# Patient Record
Sex: Female | Born: 1935 | Race: White | Hispanic: No | Marital: Single | Smoking: Never smoker
Health system: Southern US, Community
[De-identification: ages and names within clinical notes are randomized; demographics above are authoritative.]

## PROBLEM LIST (undated history)

## (undated) DIAGNOSIS — K5190 Ulcerative colitis, unspecified, without complications: Secondary | ICD-10-CM

## (undated) DIAGNOSIS — A048 Other specified bacterial intestinal infections: Secondary | ICD-10-CM

## (undated) DIAGNOSIS — N814 Uterovaginal prolapse, unspecified: Secondary | ICD-10-CM

## (undated) DIAGNOSIS — H349 Unspecified retinal vascular occlusion: Secondary | ICD-10-CM

## (undated) DIAGNOSIS — F419 Anxiety disorder, unspecified: Secondary | ICD-10-CM

## (undated) DIAGNOSIS — A0472 Enterocolitis due to Clostridium difficile, not specified as recurrent: Secondary | ICD-10-CM

## (undated) DIAGNOSIS — L0390 Cellulitis, unspecified: Secondary | ICD-10-CM

## (undated) DIAGNOSIS — M8580 Other specified disorders of bone density and structure, unspecified site: Secondary | ICD-10-CM

## (undated) HISTORY — DX: Anxiety disorder, unspecified: F41.9

## (undated) HISTORY — DX: Other specified disorders of bone density and structure, unspecified site: M85.80

## (undated) HISTORY — DX: Uterovaginal prolapse, unspecified: N81.4

## (undated) HISTORY — DX: Ulcerative colitis, unspecified, without complications: K51.90

## (undated) HISTORY — PX: TONSILLECTOMY: SUR1361

## (undated) HISTORY — PX: OTHER SURGICAL HISTORY: SHX169

## (undated) HISTORY — PX: COLONOSCOPY: SHX174

## (undated) HISTORY — DX: Cellulitis, unspecified: L03.90

## (undated) HISTORY — DX: Unspecified retinal vascular occlusion: H34.9

## (undated) SURGERY — Surgical Case
Anesthesia: *Unknown

---

## 2003-06-13 ENCOUNTER — Other Ambulatory Visit: Admission: RE | Admit: 2003-06-13 | Discharge: 2003-06-13 | Payer: Self-pay | Admitting: Obstetrics and Gynecology

## 2003-07-03 ENCOUNTER — Encounter: Admission: RE | Admit: 2003-07-03 | Discharge: 2003-07-03 | Payer: Self-pay | Admitting: Obstetrics and Gynecology

## 2003-12-29 ENCOUNTER — Encounter: Admission: RE | Admit: 2003-12-29 | Discharge: 2003-12-29 | Payer: Self-pay | Admitting: Family Medicine

## 2004-04-14 ENCOUNTER — Ambulatory Visit: Payer: Self-pay | Admitting: Internal Medicine

## 2004-05-03 ENCOUNTER — Ambulatory Visit: Payer: Self-pay | Admitting: Internal Medicine

## 2004-07-20 ENCOUNTER — Encounter: Admission: RE | Admit: 2004-07-20 | Discharge: 2004-07-20 | Payer: Self-pay | Admitting: Obstetrics and Gynecology

## 2004-10-28 ENCOUNTER — Ambulatory Visit: Payer: Self-pay | Admitting: Internal Medicine

## 2004-12-17 ENCOUNTER — Ambulatory Visit: Payer: Self-pay | Admitting: Internal Medicine

## 2005-01-19 ENCOUNTER — Ambulatory Visit: Payer: Self-pay | Admitting: Internal Medicine

## 2005-02-23 ENCOUNTER — Ambulatory Visit: Payer: Self-pay | Admitting: Internal Medicine

## 2005-03-02 ENCOUNTER — Other Ambulatory Visit: Admission: RE | Admit: 2005-03-02 | Discharge: 2005-03-02 | Payer: Self-pay | Admitting: Obstetrics and Gynecology

## 2005-07-25 ENCOUNTER — Encounter: Admission: RE | Admit: 2005-07-25 | Discharge: 2005-07-25 | Payer: Self-pay | Admitting: Obstetrics and Gynecology

## 2005-12-26 ENCOUNTER — Ambulatory Visit: Payer: Self-pay | Admitting: Internal Medicine

## 2006-04-10 ENCOUNTER — Other Ambulatory Visit: Admission: RE | Admit: 2006-04-10 | Discharge: 2006-04-10 | Payer: Self-pay | Admitting: Obstetrics and Gynecology

## 2006-08-23 ENCOUNTER — Encounter: Admission: RE | Admit: 2006-08-23 | Discharge: 2006-08-23 | Payer: Self-pay | Admitting: Obstetrics and Gynecology

## 2006-11-01 ENCOUNTER — Ambulatory Visit: Payer: Self-pay | Admitting: Internal Medicine

## 2006-11-28 ENCOUNTER — Encounter: Payer: Self-pay | Admitting: Internal Medicine

## 2006-11-28 ENCOUNTER — Ambulatory Visit: Payer: Self-pay | Admitting: Internal Medicine

## 2007-08-09 DIAGNOSIS — K5190 Ulcerative colitis, unspecified, without complications: Secondary | ICD-10-CM | POA: Insufficient documentation

## 2007-08-09 DIAGNOSIS — F411 Generalized anxiety disorder: Secondary | ICD-10-CM | POA: Insufficient documentation

## 2007-08-09 DIAGNOSIS — R109 Unspecified abdominal pain: Secondary | ICD-10-CM | POA: Insufficient documentation

## 2007-08-13 ENCOUNTER — Ambulatory Visit: Payer: Self-pay | Admitting: Internal Medicine

## 2007-08-28 ENCOUNTER — Encounter: Admission: RE | Admit: 2007-08-28 | Discharge: 2007-08-28 | Payer: Self-pay | Admitting: Obstetrics and Gynecology

## 2007-12-03 ENCOUNTER — Ambulatory Visit: Payer: Self-pay | Admitting: Internal Medicine

## 2007-12-03 ENCOUNTER — Telehealth: Payer: Self-pay | Admitting: Internal Medicine

## 2007-12-03 DIAGNOSIS — R142 Eructation: Secondary | ICD-10-CM | POA: Insufficient documentation

## 2007-12-03 DIAGNOSIS — R141 Gas pain: Secondary | ICD-10-CM | POA: Insufficient documentation

## 2007-12-03 DIAGNOSIS — R143 Flatulence: Secondary | ICD-10-CM

## 2007-12-04 LAB — CONVERTED CEMR LAB
Basophils Absolute: 0 10*3/uL (ref 0.0–0.1)
Basophils Relative: 0.6 % (ref 0.0–3.0)
Eosinophils Absolute: 0.1 10*3/uL (ref 0.0–0.7)
Eosinophils Relative: 1.5 % (ref 0.0–5.0)
HCT: 40.2 % (ref 36.0–46.0)
Hemoglobin: 13.8 g/dL (ref 12.0–15.0)
Lymphocytes Relative: 20.7 % (ref 12.0–46.0)
MCHC: 34.3 g/dL (ref 30.0–36.0)
MCV: 90 fL (ref 78.0–100.0)
Monocytes Absolute: 0.5 10*3/uL (ref 0.1–1.0)
Monocytes Relative: 6.3 % (ref 3.0–12.0)
Neutro Abs: 5.4 10*3/uL (ref 1.4–7.7)
Neutrophils Relative %: 70.9 % (ref 43.0–77.0)
Platelets: 341 10*3/uL (ref 150–400)
RBC: 4.46 M/uL (ref 3.87–5.11)
RDW: 13.2 % (ref 11.5–14.6)
WBC: 7.6 10*3/uL (ref 4.5–10.5)

## 2007-12-06 ENCOUNTER — Telehealth: Payer: Self-pay | Admitting: Internal Medicine

## 2007-12-31 ENCOUNTER — Ambulatory Visit: Payer: Self-pay | Admitting: Internal Medicine

## 2008-01-28 ENCOUNTER — Telehealth: Payer: Self-pay | Admitting: Internal Medicine

## 2008-03-10 ENCOUNTER — Ambulatory Visit: Payer: Self-pay | Admitting: Internal Medicine

## 2008-06-03 ENCOUNTER — Telehealth: Payer: Self-pay | Admitting: Internal Medicine

## 2008-07-31 ENCOUNTER — Ambulatory Visit: Payer: Self-pay | Admitting: Obstetrics and Gynecology

## 2008-07-31 ENCOUNTER — Other Ambulatory Visit: Admission: RE | Admit: 2008-07-31 | Discharge: 2008-07-31 | Payer: Self-pay | Admitting: Obstetrics and Gynecology

## 2008-07-31 ENCOUNTER — Encounter: Payer: Self-pay | Admitting: Obstetrics and Gynecology

## 2008-08-19 ENCOUNTER — Ambulatory Visit: Payer: Self-pay | Admitting: Obstetrics and Gynecology

## 2008-08-21 ENCOUNTER — Ambulatory Visit: Payer: Self-pay | Admitting: Internal Medicine

## 2008-08-22 LAB — CONVERTED CEMR LAB
ALT: 32 units/L (ref 0–35)
AST: 30 units/L (ref 0–37)
Albumin: 4.1 g/dL (ref 3.5–5.2)
Alkaline Phosphatase: 72 units/L (ref 39–117)
BUN: 19 mg/dL (ref 6–23)
Basophils Absolute: 0.1 10*3/uL (ref 0.0–0.1)
Basophils Relative: 1 % (ref 0.0–3.0)
CO2: 30 meq/L (ref 19–32)
Calcium: 9.3 mg/dL (ref 8.4–10.5)
Chloride: 102 meq/L (ref 96–112)
Creatinine, Ser: 0.6 mg/dL (ref 0.4–1.2)
Eosinophils Absolute: 0.2 10*3/uL (ref 0.0–0.7)
Eosinophils Relative: 2.9 % (ref 0.0–5.0)
GFR calc non Af Amer: 104.85 mL/min (ref >60)
Glucose, Bld: 94 mg/dL (ref 70–99)
HCT: 44.6 % (ref 36.0–46.0)
Hemoglobin: 15.2 g/dL — ABNORMAL HIGH (ref 12.0–15.0)
Lymphocytes Relative: 17.4 % (ref 12.0–46.0)
Lymphs Abs: 1.5 10*3/uL (ref 0.7–4.0)
MCHC: 34 g/dL (ref 30.0–36.0)
MCV: 92.9 fL (ref 78.0–100.0)
Monocytes Absolute: 0.3 10*3/uL (ref 0.1–1.0)
Monocytes Relative: 3.8 % (ref 3.0–12.0)
Neutro Abs: 6.4 10*3/uL (ref 1.4–7.7)
Neutrophils Relative %: 74.9 % (ref 43.0–77.0)
Platelets: 310 10*3/uL (ref 150.0–400.0)
Potassium: 4.3 meq/L (ref 3.5–5.1)
RBC: 4.8 M/uL (ref 3.87–5.11)
RDW: 13.7 % (ref 11.5–14.6)
Sed Rate: 17 mm/hr (ref 0–22)
Sodium: 139 meq/L (ref 135–145)
TSH: 1.01 microintl units/mL (ref 0.35–5.50)
Total Bilirubin: 1.6 mg/dL — ABNORMAL HIGH (ref 0.3–1.2)
Total Protein: 7.4 g/dL (ref 6.0–8.3)
WBC: 8.5 10*3/uL (ref 4.5–10.5)

## 2008-10-08 ENCOUNTER — Encounter: Admission: RE | Admit: 2008-10-08 | Discharge: 2008-10-08 | Payer: Self-pay | Admitting: Obstetrics and Gynecology

## 2008-10-14 ENCOUNTER — Encounter: Admission: RE | Admit: 2008-10-14 | Discharge: 2008-10-14 | Payer: Self-pay | Admitting: Obstetrics and Gynecology

## 2008-12-16 ENCOUNTER — Ambulatory Visit: Payer: Self-pay | Admitting: Internal Medicine

## 2009-04-27 ENCOUNTER — Telehealth: Payer: Self-pay | Admitting: Internal Medicine

## 2009-04-27 ENCOUNTER — Encounter (INDEPENDENT_AMBULATORY_CARE_PROVIDER_SITE_OTHER): Payer: Self-pay | Admitting: *Deleted

## 2009-06-10 ENCOUNTER — Ambulatory Visit: Payer: Self-pay | Admitting: Internal Medicine

## 2009-09-08 ENCOUNTER — Telehealth: Payer: Self-pay | Admitting: Internal Medicine

## 2009-09-09 ENCOUNTER — Ambulatory Visit: Payer: Self-pay | Admitting: Gastroenterology

## 2009-09-21 ENCOUNTER — Telehealth (INDEPENDENT_AMBULATORY_CARE_PROVIDER_SITE_OTHER): Payer: Self-pay | Admitting: *Deleted

## 2009-09-22 ENCOUNTER — Ambulatory Visit: Payer: Self-pay | Admitting: Obstetrics and Gynecology

## 2009-09-22 ENCOUNTER — Other Ambulatory Visit: Admission: RE | Admit: 2009-09-22 | Discharge: 2009-09-22 | Payer: Self-pay | Admitting: Obstetrics and Gynecology

## 2009-09-28 ENCOUNTER — Ambulatory Visit: Payer: Self-pay | Admitting: Internal Medicine

## 2009-09-29 LAB — CONVERTED CEMR LAB
Bilirubin Urine: NEGATIVE
Hemoglobin, Urine: NEGATIVE
Ketones, ur: NEGATIVE mg/dL
Leukocytes, UA: NEGATIVE
Nitrite: NEGATIVE
Specific Gravity, Urine: 1.02 (ref 1.000–1.030)
Total Protein, Urine: NEGATIVE mg/dL
Urine Glucose: NEGATIVE mg/dL
Urobilinogen, UA: 0.2 (ref 0.0–1.0)
pH: 7 (ref 5.0–8.0)

## 2009-10-15 ENCOUNTER — Encounter: Admission: RE | Admit: 2009-10-15 | Discharge: 2009-10-15 | Payer: Self-pay | Admitting: Obstetrics and Gynecology

## 2010-03-16 NOTE — Assessment & Plan Note (Signed)
Summary: UC flare/sheri   History of Present Illness Visit Type: Follow-up Visit Primary GI MD: [name] MD Primary Provider: [facility] Requesting Provider: n/a Chief Complaint: ulcerative colitis flare History of Present Illness:   Patient is followed by Dr. Juanda Chance for longstanding ulcerative colitis.  Patient last seen 06/10/09 at which time she was doing well on Lialda.  Earlier this month patient went to [place] to visit son and his family. Patient's daugher-in-law creates very stressful situation for the patient and this always causes gastrointestinal upset. After returning home from trip two weeks ago patient developed bloody, mucoid stools associated with lower abdominal cramps and lower back pain. No fevers. Today, the patient actually had a normal BM and feels better.    GI Review of Systems    Reports abdominal pain and  bloating.     Location of  Abdominal pain: lower abdomen.    Denies acid reflux, belching, chest pain, dysphagia with liquids, dysphagia with solids, heartburn, loss of appetite, nausea, vomiting, vomiting blood, weight loss, and  weight gain.      Reports rectal bleeding.     Denies anal fissure, black tarry stools, change in bowel habit, constipation, diarrhea, diverticulosis, fecal incontinence, heme positive stool, hemorrhoids, irritable bowel syndrome, jaundice, light color stool, liver problems, and  rectal pain.   Current Medications (verified): 1)  Methscopolamine Bromide 5 Mg  Tabs (Methscopolamine Bromide) .... Take 1/2 Tablet Every Morning, 1/2 Tablet Every Evening 2)  Ocuvite   Tabs (Multiple Vitamins-Minerals) .Marland Kitchen.. 1 Tablet Once Daily 3)  Lexapro 10 Mg  Tabs (Escitalopram Oxalate) .... Take 1 Tablet By Mouth Once A Day 4)  Vitamin D 1000 Unit Tabs (Cholecalciferol) .... Two Tablets By Mouth Once Daily 5)  Lialda 1.2 Gm Tbec (Mesalamine) .... Take 1 Tablet By Mouth Two Times A Day  Allergies (verified): 1)  ! Pcn  Past History:  Past Medical  History: Hx of ANXIETY (ICD-300.00) ULCERATIVE COLITIS (ICD-556.9)  Past Surgical History: Reviewed history from 12/03/2007 and no changes required. Tonsillectomy  Family History: Reviewed history from 03/10/2008 and no changes required. Family History of Colon Cancer: Mother Family History of Pancreatic Cancer: Father Family History of Heart Disease: Brother and mother Family History of Breast Cancer: Sister, mother, Aunt Family History of Clotting disorder: mother Family History of Inflammatory Bowel Disease: Sister?  Social History: Reviewed history from 12/03/2007 and no changes required. Alcohol Use - yes-3 glasses weekly Patient has never smoked.  Daily Caffeine Use-1-2 cups daily Illicit Drug Use - no Patient does not get regular exercise.   Review of Systems       The patient complains of arthritis/joint pain, back pain, night sweats, sleeping problems, and urination - excessive.  The patient denies allergy/sinus, anemia, anxiety-new, blood in urine, breast changes/lumps, change in vision, confusion, cough, coughing up blood, depression-new, fainting, fatigue, fever, headaches-new, hearing problems, heart murmur, heart rhythm changes, itching, menstrual pain, muscle pains/cramps, nosebleeds, pregnancy symptoms, shortness of breath, skin rash, sore throat, swelling of feet/legs, swollen lymph glands, thirst - excessive, urination changes/pain, urine leakage, vision changes, and voice change.    Vital Signs:  Patient profile:   75 year old female Height:      60 inches Weight:      148.50 pounds BMI:     29.11 Pulse rate:   64 / minute Pulse rhythm:   regular BP sitting:   116 / 78  (left arm) Cuff size:   regular  Vitals Entered By: June McMurray CMA (  AAMA) (September 09, 2009 1:26 PM)  Physical Exam  General:  Well developed, pleasant white female Head:  Normocephalic and atraumatic. Eyes:  Conjunctiva pink, no icterus.  Neck:  no obvious masses  Lungs:  Clear  throughout to auscultation. Heart:  Regular rate and rhythm; no murmurs, rubs,  or bruits. Abdomen:  Abdomen soft, nontender, nondistended. No obvious masses or hepatomegaly.Normal bowel sounds.  Msk:  Symmetrical with no gross deformities. Normal posture. Extremities:  No palmar erythema, no edema.  Neurologic:  Alert and  oriented x4;  grossly normal neurologically. Skin:  Intact without significant lesions or rashes. Cervical Nodes:  No significant cervical adenopathy. Psych:  Alert and cooperative. Normal mood and affect.  Impression & Recommendations:  Problem # 1:  ULCERATIVE COLITIS (ICD-556.9) Assessment Deteriorated Disease was inactive on last colonoscopy Oct. 2008. Had been doing well on Lialda until development of flare-like symptoms two weeks ago after encountering stressful situation. Patient actually doing better today. She looks good, abdominal exam benign. She has used Canasa suppositories and steroid enemas in the past. Will give her a two week course of Hydrocortisone enemas. Low residue diet until symptoms subside. Will schedule her for a routine follow up with Dr. Juanda Chance but in the meantime she will call with condition update in a few days.  Problem # 2:  Hx of ANXIETY (ICD-300.00) Assessment: Comment Only A certain family situation causes patient significant stress. Not exposed to the situation often but when she is, ulcerative colitis seems to flare. Patient prescribed Lexapro but doesn't take it everyday. We discussed need for therapeutic blood levels with these type medications. Patient will take daily as directed and understands stopping the medication may require a weaning process.  Patient Instructions: 1)  We made you a follow up with Dr. Juanda Chance for 09-28-09 at 8:45Am.  2)  We sent a prescription for Cortenemas to [facility].  3)  We have given you a Low Residue Diet. 4)  Copy sent to : [facility] 5)  The medication list was reviewed and  reconciled.  All changed / newly prescribed medications were explained.  A complete medication list was provided to the patient / caregiver.  Prescriptions: CORTENEMA 100 MG/60ML ENEM (HYDROCORTISONE) Use 1 enema  at bedtime x 14 days  #14 x 0   Entered by:   Lowry Ram NCMA   Authorized by:   Willette Cluster NP   Signed by:   Lowry Ram NCMA on 09/09/2009   Method used:   Electronically to        [facility]. [phone]* (retail)       [phone] [address].       [place], [place]  [address]       Ph: [phone]       Fax: [phone]   RxID:   [id number]

## 2010-03-16 NOTE — Letter (Signed)
Summary: Appt Reminder 2  [facility]  [address] [place], [place] [address]   Phone: [phone]  Fax: [phone]        April 27, 2009 MRN: [id number]    Erika Fowler [address] [place], [place]  [address]    Dear Ms. [name],   You have a return appointment with Dr.Dora Juanda Chance on 06-10-09 at 3:45pm. Please remember to bring a complete list of the medicines you are taking, your insurance card and your co-pay.  If you have to cancel or reschedule this appointment, please call before 5:00 pm the evening before to avoid a cancellation fee.  If you have any questions or concerns, please call [phone].    Sincerely,    Laureen Ochs LPN  Appended Document: Appt Reminder 2 Letter mailed to patient.

## 2010-03-16 NOTE — Assessment & Plan Note (Signed)
Summary: F/U FROM OV 12-16-08              Erika Fowler    History of Present Illness Visit Type: Follow-up Visit Primary GI MD: [name] MD Primary Provider: n/a Requesting Provider: n/a Chief Complaint: F/u for ulcerative colitis. Pt states that she is great and denies any GI complaints  History of Present Illness:   This is a 75 year old white female with left-sided ulcerative colitis since 29. Her last flareup was in the fall of 2009. She is maintained on Lialda 1.2 g twice a day. She denies any rectal bleeding or abdominal pain. Her last colonoscopy in October 2008 showed minimal activity of the left-sided colitis.   GI Review of Systems      Denies abdominal pain, acid reflux, belching, bloating, chest pain, dysphagia with liquids, dysphagia with solids, heartburn, loss of appetite, nausea, vomiting, vomiting blood, weight loss, and  weight gain.        Denies anal fissure, black tarry stools, change in bowel habit, constipation, diarrhea, diverticulosis, fecal incontinence, heme positive stool, hemorrhoids, irritable bowel syndrome, jaundice, light color stool, liver problems, rectal bleeding, and  rectal pain.    Current Medications (verified): 1)  Methscopolamine Bromide 5 Mg  Tabs (Methscopolamine Bromide) .... Take 1/2 Tablet Every Morning, 1/2 Tablet Every Evening 2)  Ocuvite   Tabs (Multiple Vitamins-Minerals) .Marland Kitchen.. 1 Tablet Once Daily 3)  Lexapro 10 Mg  Tabs (Escitalopram Oxalate) .... Take 1 Tablet By Mouth Once A Day 4)  Vitamin D 1000 Unit Tabs (Cholecalciferol) .... Two Tablets By Mouth Once Daily 5)  Lialda 1.2 Gm Tbec (Mesalamine) .... Take 1 Tablet By Mouth Two Times A Day  Allergies (verified): 1)  ! Pcn  Past History:  Past Medical History: Reviewed history from 08/09/2007 and no changes required. Current Problems:  ABDOMINAL CRAMPS (ICD-789.00) Hx of ANXIETY (ICD-300.00) ULCERATIVE COLITIS (ICD-556.9)  Past Surgical History: Reviewed history from  12/03/2007 and no changes required. Tonsillectomy  Family History: Reviewed history from 03/10/2008 and no changes required. Family History of Colon Cancer: Mother Family History of Pancreatic Cancer: Father Family History of Heart Disease: Brother and mother Family History of Breast Cancer: Sister, mother, Aunt Family History of Clotting disorder: mother Family History of Inflammatory Bowel Disease: Sister?  Social History: Reviewed history from 12/03/2007 and no changes required. Alcohol Use - yes-3 glasses weekly Patient has never smoked.  Daily Caffeine Use-1-2 cups daily Illicit Drug Use - no Patient does not get regular exercise.   Review of Systems  The patient denies allergy/sinus, anemia, anxiety-new, arthritis/joint pain, back pain, blood in urine, breast changes/lumps, change in vision, confusion, cough, coughing up blood, depression-new, fainting, fatigue, fever, headaches-new, hearing problems, heart murmur, heart rhythm changes, itching, menstrual pain, muscle pains/cramps, night sweats, nosebleeds, pregnancy symptoms, shortness of breath, skin rash, sleeping problems, sore throat, swelling of feet/legs, swollen lymph glands, thirst - excessive , urination - excessive , urination changes/pain, urine leakage, vision changes, and voice change.         Pertinent positive and negative review of systems were noted in the above HPI. All other ROS was otherwise negative.   Vital Signs:  Patient profile:   75 year old female Height:      61 inches Weight:      153 pounds BMI:     29.01 BSA:     1.69 Pulse rate:   88 / minute Pulse rhythm:   regular BP sitting:   126 / 74  (left arm)  Cuff size:   regular  Vitals Entered By: Ok Anis CMA (June 10, 2009 3:20 PM)  Physical Exam  General:  Well developed, well nourished, no acute distress. Eyes:  PERRLA, no icterus. Mouth:  No deformity or lesions, dentition normal. Neck:  Supple; no masses or thyromegaly. Lungs:   Clear throughout to auscultation. Heart:  Regular rate and rhythm; no murmurs, rubs,  or bruits. Abdomen:  Soft, nontender and nondistended. No masses, hepatosplenomegaly or hernias noted. Normal bowel sounds. Rectal:  Hemoccult positive yellow stool. Extremities:  No clubbing, cyanosis, edema or deformities noted. Skin:  Intact without significant lesions or rashes. Psych:  Alert and cooperative. Normal mood and affect.   Impression & Recommendations:  Problem # 1:  ULCERATIVE COLITIS (ICD-556.9) Patient has left-sided ulcerative colitis in symptomatic remission. Her last flareup was over 1 and 1/2 years ago. She is Hemoccult-positive today which could be due to hemorrhoids or possibly due to low activity of the left-sided colitis. She is to continue on her Lialda 1.2 g 2 tablets a day. She will return in 6 months.  Patient Instructions: 1)  Lialda 1.2 gm 2 per day. 2)  Office visit 6 months. 3)  The medication list was reviewed and reconciled.  All changed / newly prescribed medications were explained.  A complete medication list was provided to the patient / caregiver.

## 2010-03-16 NOTE — Progress Notes (Signed)
Summary: Progress report   Phone Note Call from Patient   Caller: Patient Call For: Erika Fowler CMA Summary of Call: The pt called to inform us that the cortenemas worked well and she is doing much better.  She asked me to let [name] know. Initial call taken by: Joselyn Glassman,  September 21, 2009 10:45 AM

## 2010-03-16 NOTE — Progress Notes (Signed)
Summary: Triage   Phone Note Call from Patient Call back at Home Phone [phone]   Caller: Patient Call For: Dr. Juanda Chance Reason for Call: Talk to Nurse Summary of Call: having rectal bleeding, lower back ache x1week Initial call taken by: Karna Christmas,  September 08, 2009 10:22 AM  Follow-up for Phone Call        Patient  c/o 1 week hx of urgency /diarrhea , blood and mucus in her stool.  Pain in her lower back and side that has been present for 1 week also.  She reports still on her Lialda and no other changes.  Pt will come in and see Willette Cluster RNP 09/09/09 1:30  Follow-up by: Darcey Nora RN, CGRN,  September 08, 2009 10:46 AM

## 2010-03-16 NOTE — Progress Notes (Signed)
Summary: REV Scheduled   Phone Note Call from Patient Call back at Home Phone [phone]   Caller: Patient Call For: Dr. Juanda Chance Reason for Call: Talk to Nurse Summary of Call: pt thinks that at her last office visit in 2010 Dr. Juanda Chance asked her to follow up sometime in 2011 regarding how pt is feeling on medications, Lialda, Lexapro and Mephscopolamine Bromide... pt wants to verify this before making an appt... pt not having any problems Initial call taken by: Vallarie Mare,  April 27, 2009 11:44 AM  Follow-up for Phone Call        Last OV 12-16-08, needs a 6 month f/u. She is scheduled for 06-10-09 at 3:45pm. No GI c/o at this time. Pt. instructed to call back as needed.  Follow-up by: Laureen Ochs LPN,  April 27, 2009 12:02 PM

## 2010-03-16 NOTE — Assessment & Plan Note (Signed)
Summary: F/U UC Flare, saw Willette Cluster NP    History of Present Illness Visit Type: Follow-up Visit Primary GI MD: [name] MD Primary Provider: [facility] Requesting Provider: n/a Chief Complaint: F/u for ulcerative colitis. Pt states that she feels better and denies any GI complaints  History of Present Illness:   This is a 75 year old white female with ulcerative colitis who is status post flareup 4 weeks ago after returning from a trip to see her family in [place]. The flare up was precipitated by stress as were the previous attacks. She has had ulcerative colitis since the 1980s. Her last colonoscopy in October 2008 showed an essentially normal colon. This time she responded to Cort enemas 1 qhs x 2 weeks. She has been off for at least a week and denies any visible blood per rectum or diarrhea but has left upper quadrant abdominal discomfort. She has a family history of colon cancer in her mother and questionable inflammatory bowel disease in her sister. She has been on Lialda 1.2 g 2 a day.   GI Review of Systems      Denies abdominal pain, acid reflux, belching, bloating, chest pain, dysphagia with liquids, dysphagia with solids, heartburn, loss of appetite, nausea, vomiting, vomiting blood, weight loss, and  weight gain.        Denies anal fissure, black tarry stools, change in bowel habit, constipation, diarrhea, diverticulosis, fecal incontinence, heme positive stool, hemorrhoids, irritable bowel syndrome, jaundice, light color stool, liver problems, rectal bleeding, and  rectal pain.    Current Medications (verified): 1)  Methscopolamine Bromide 5 Mg  Tabs (Methscopolamine Bromide) .... Take 1/2 Tablet Every Morning, 1/2 Tablet Every Evening 2)  Ocuvite   Tabs (Multiple Vitamins-Minerals) .Marland Kitchen.. 1 Tablet Once Daily 3)  Lexapro 10 Mg  Tabs (Escitalopram Oxalate) .... Take 1 Tablet By Mouth Once A Day 4)  Vitamin D 1000 Unit Tabs (Cholecalciferol) .... Two Tablets By  Mouth Once Daily 5)  Lialda 1.2 Gm Tbec (Mesalamine) .... Take 1 Tablet By Mouth Two Times A Day 6)  Cortenema 100 Mg/88ml Enem (Hydrocortisone) .... Use 1 Enema  At Bedtime X 14 Days 7)  Nitrofurantoin Macrocrystal 100 Mg Caps (Nitrofurantoin Macrocrystal) .... One Tablet By Mouth Two Times A Day(Finished Today)  Allergies (verified): 1)  ! Pcn  Past History:  Past Medical History: Reviewed history from 09/09/2009 and no changes required. Hx of ANXIETY (ICD-300.00) ULCERATIVE COLITIS (ICD-556.9)  Past Surgical History: Reviewed history from 12/03/2007 and no changes required. Tonsillectomy  Family History: Reviewed history from 03/10/2008 and no changes required. Family History of Colon Cancer: Mother Family History of Pancreatic Cancer: Father Family History of Heart Disease: Brother and mother Family History of Breast Cancer: Sister, mother, Aunt Family History of Clotting disorder: mother Family History of Inflammatory Bowel Disease: Sister?  Social History: Reviewed history from 12/03/2007 and no changes required. Alcohol Use - yes-3 glasses weekly Patient has never smoked.  Daily Caffeine Use-1-2 cups daily Illicit Drug Use - no Patient does not get regular exercise.   Review of Systems       Pertinent positive and negative review of systems were noted in the above HPI. All other ROS was otherwise negative.   Vital Signs:  Patient profile:   75 year old female Height:      60 inches Weight:      150 pounds BMI:     29.40 BSA:     1.65 Pulse rate:   62 / minute Pulse rhythm:  regular BP sitting:   120 / 80  (left arm) Cuff size:   regular  Vitals Entered By: Ok Anis CMA (September 28, 2009 8:46 AM)  Physical Exam  General:  Well developed, well nourished, no acute distress. Mouth:  No deformity or lesions, dentition normal. Neck:  Supple; no masses or thyromegaly. Lungs:  Clear throughout to auscultation. Heart:  Regular rate and rhythm; no murmurs,  rubs,  or bruits. Abdomen:  tenderness in left upper quadrant and along the sigmoid colon in the left lower quadrant. Right side of the abdomen is unremarkable. Normal active bowel sounds. No palpable mass. Rectal:  rectal and anoscopic exam reveals normal perianal area and mildly erythematous rectum, purulent with no friability, granularity or bleeding. Stool is Hemoccult positive. Extremities:  No clubbing, cyanosis, edema or deformities noted.   Impression & Recommendations:  Problem # 1:  ULCERATIVE COLITIS (ICD-556.9) Patient is status post flare up of ulcerative colitis. She has symptomatically improved on cort enemas. Stool is still Hemoccult-positive and her left colon is tender. She will resume cort enemas twice a week for 3 weeks.  Problem # 2:  Hx of ANXIETY (ICD-300.00) stable.  Other Orders: TLB-Udip w/ [facility] (81001-URINE)  Patient Instructions: 1)  Urinalysis today. 2)  Cort enema one at bedtime twice a week for 3 weeks. 3)  Office visit 6 months or earlier if she has another flareup. 4)  Copy sent to : [facility], Dr D.Gottsegan 5)  The medication list was reviewed and reconciled.  All changed / newly prescribed medications were explained.  A complete medication list was provided to the patient / caregiver. Prescriptions: CORTENEMA 100 MG/60ML ENEM (HYDROCORTISONE) Use 1 enema at bedtime twice per week x 3 weeks  #7 x 0   Entered by:   Lamona Curl CMA (AAMA)   Authorized by:   Hart Carwin MD   Signed by:   Lamona Curl CMA (AAMA) on 09/28/2009   Method used:   Electronically to        [facility]. [phone]* (retail)       [phone] [address].       [place], [place]  [address]       Ph: [phone]       Fax: [phone]   RxID:   [id number]

## 2010-03-31 ENCOUNTER — Encounter: Payer: Self-pay | Admitting: Internal Medicine

## 2010-03-31 ENCOUNTER — Other Ambulatory Visit: Payer: Self-pay | Admitting: Internal Medicine

## 2010-03-31 ENCOUNTER — Encounter (INDEPENDENT_AMBULATORY_CARE_PROVIDER_SITE_OTHER): Payer: Self-pay | Admitting: *Deleted

## 2010-03-31 ENCOUNTER — Other Ambulatory Visit: Payer: PRIVATE HEALTH INSURANCE

## 2010-03-31 ENCOUNTER — Ambulatory Visit (INDEPENDENT_AMBULATORY_CARE_PROVIDER_SITE_OTHER): Payer: PRIVATE HEALTH INSURANCE | Admitting: Internal Medicine

## 2010-03-31 DIAGNOSIS — K5100 Ulcerative (chronic) pancolitis without complications: Secondary | ICD-10-CM

## 2010-03-31 DIAGNOSIS — K5190 Ulcerative colitis, unspecified, without complications: Secondary | ICD-10-CM

## 2010-03-31 LAB — HEMOGLOBIN A1C: Hgb A1c MFr Bld: 6 % (ref 4.6–6.5)

## 2010-04-01 LAB — CBC WITH DIFFERENTIAL/PLATELET
Basophils Absolute: 0 10*3/uL (ref 0.0–0.1)
Eosinophils Absolute: 0 10*3/uL (ref 0.0–0.7)
Eosinophils Relative: 0.4 % (ref 0.0–5.0)
HCT: 44.5 % (ref 36.0–46.0)
Lymphocytes Relative: 22.2 % (ref 12.0–46.0)
MCV: 90.9 fl (ref 78.0–100.0)
Monocytes Absolute: 0.4 10*3/uL (ref 0.1–1.0)
Monocytes Relative: 4.8 % (ref 3.0–12.0)
Neutrophils Relative %: 72.2 % (ref 43.0–77.0)
Platelets: 291 10*3/uL (ref 150.0–400.0)
RBC: 4.9 Mil/uL (ref 3.87–5.11)
RDW: 14.2 % (ref 11.5–14.6)

## 2010-04-07 NOTE — Assessment & Plan Note (Signed)
Summary: [name]  SCHED WITH PT/MEDLIST/CX POLICY/WELLPATH   Vital Signs:  Patient profile:   75 year old female Height:      60 inches Weight:      149.50 pounds BMI:     29.30 Pulse rate:   88 / minute Pulse rhythm:   regular BP sitting:   118 / 68  (left arm)  Vitals Entered By: Milford Cage NCMA (March 31, 2010 1:59 PM)  History of Present Illness Visit Type: Follow-up Visit Primary GI MD: [name] MD Primary Provider: [facility] Requesting Provider: n/a Chief Complaint: follow-up Ulcerative Colitis History of Present Illness:   This is a 75 year old white female with ulcerative colitis since 35. Her last colonoscopy in 2008 showed a normal colon. She has been in remission since August 2011. She recently traveled to see her family in [place] under stressful circumstances. She denies any abdominal pain, diarrhea or rectal bleeding. She is on a maintenance dose of Lialda 1.2 g 2 tablets daily. She is also on Lexapro 10 mg a day and antispasmodics. There is a family history of colon cancer in her mother and inflammatory bowel disease in her sister.   GI Review of Systems      Denies abdominal pain, acid reflux, belching, bloating, chest pain, dysphagia with liquids, dysphagia with solids, heartburn, loss of appetite, nausea, vomiting, vomiting blood, weight loss, and  weight gain.        Denies anal fissure, black tarry stools, change in bowel habit, constipation, diarrhea, diverticulosis, fecal incontinence, heme positive stool, hemorrhoids, irritable bowel syndrome, jaundice, light color stool, liver problems, rectal bleeding, and  rectal pain.  Current Medications (verified): 1)  Methscopolamine Bromide 5 Mg  Tabs (Methscopolamine Bromide) .... Take 1/2 Tablet Every Morning, 1/2 Tablet Every Evening 2)  Ocuvite   Tabs (Multiple Vitamins-Minerals) .Marland Kitchen.. 1 Tablet Once Daily 3)  Lexapro 10 Mg  Tabs (Escitalopram Oxalate) .... Take 1 Tablet By Mouth Once A  Day 4)  Vitamin D 1000 Unit Tabs (Cholecalciferol) .... Two Tablets By Mouth Once Daily 5)  Lialda 1.2 Gm Tbec (Mesalamine) .... Take 1 Tablet By Mouth Two Times A Day 6)  Cortenema 100 Mg/75ml Enem (Hydrocortisone) .... Use 1 Enema At Bedtime Twice Per Week X 3 Weeks Use As Needed Uc Flare 7)  Nitrofurantoin Macrocrystal 100 Mg Caps (Nitrofurantoin Macrocrystal) .... One Tablet By Mouth Two Times A Day(Finished Today) As Needed Uc Flare  Allergies (verified): No Known Drug Allergies  Past History:  Past Medical History: Reviewed history from 09/09/2009 and no changes required. Hx of ANXIETY (ICD-300.00) ULCERATIVE COLITIS (ICD-556.9)  Past Surgical History: Reviewed history from 12/03/2007 and no changes required. Tonsillectomy  Family History: Reviewed history from 03/10/2008 and no changes required. Family History of Colon Cancer: Mother Family History of Pancreatic Cancer: Father Family History of Heart Disease: Brother and mother Family History of Breast Cancer: Sister, mother, Aunt Family History of Clotting disorder: mother Family History of Inflammatory Bowel Disease: Sister?  Social History: Reviewed history from 12/03/2007 and no changes required. Alcohol Use - yes-3 glasses weekly Patient has never smoked.  Daily Caffeine Use-1-2 cups daily Illicit Drug Use - no Patient does not get regular exercise.   Review of Systems  The patient denies allergy/sinus, anemia, anxiety-new, arthritis/joint pain, back pain, blood in urine, breast changes/lumps, change in vision, confusion, cough, coughing up blood, depression-new, fainting, fatigue, fever, headaches-new, hearing problems, heart murmur, heart rhythm changes, itching, menstrual pain, muscle pains/cramps, night sweats, nosebleeds, pregnancy symptoms, shortness  of breath, skin rash, sleeping problems, sore throat, swelling of feet/legs, swollen lymph glands, thirst - excessive , urination - excessive , urination  changes/pain, urine leakage, vision changes, and voice change.         Pertinent positive and negative review of systems were noted in the above HPI. All other ROS was otherwise negative.   Physical Exam  General:  Well developed, well nourished, no acute distress. Eyes:  PERRLA, no icterus. Mouth:  No deformity or lesions, dentition normal. Neck:  Supple; no masses or thyromegaly. Lungs:  Clear throughout to auscultation. Heart:  Regular rate and rhythm; no murmurs, rubs,  or bruits. Abdomen:  Soft, nontender and nondistended. No masses, hepatosplenomegaly or hernias noted. Normal bowel sounds. Extremities:  No clubbing, cyanosis, edema or deformities noted. Psych:  Alert and cooperative. Normal mood and affect.   Impression & Recommendations:  Problem # 1:  ULCERATIVE COLITIS (ICD-556.9) This is in remission. She needs to continue maintenance mesalamine. Her next colonoscopy will be due in October 2013. We will refill her mesalamine today and I will check her CBC and hemoglobin A1C. Orders: TLB-CBC Platelet - w/Differential (85025-CBCD) TLB-A1C / Hgb A1C (Glycohemoglobin) (83036-A1C)  Problem # 2:  ABDOMINAL CRAMPS (ICD-789.00) This is not an active problem. She will disconinue antispasmodics and take them only p.r.n.  Patient Instructions: 1)  Your physician requests that you go to the basement floor of our office to have the following labwork completed before leaving today: CBC, A1C. 2)  recall colonoscopy October 2013. 3)  Please pick up your prescriptions at the pharmacy. Electronic prescription(s) has already been sent for Lialda 2 tablets by mouth once daily. We have also sent Lexapro 10 mg once daily. 4)  The medication list was reviewed and reconciled.  All changed / newly prescribed medications were explained.  A complete medication list was provided to the patient / caregiver. Prescriptions: LEXAPRO 10 MG  TABS (ESCITALOPRAM OXALATE) Take 1 tablet by mouth once a day   #90 x 0   Entered by:   Lamona Curl CMA (AAMA)   Authorized by:   Hart Carwin MD   Signed by:   Lamona Curl CMA (AAMA) on 03/31/2010   Method used:   Electronically to        [facility]. [phone]* (retail)       [phone]       [place], [place]  [address]       Ph: [phone]       Fax: [phone]   RxID:   [id number] LIALDA 1.2 GM TBEC (MESALAMINE) Take 1 tablet by mouth two times a day  #180 x 0   Entered by:   Lamona Curl CMA (AAMA)   Authorized by:   Hart Carwin MD   Signed by:   Lamona Curl CMA (AAMA) on 03/31/2010   Method used:   Electronically to        [facility]. [phone]* (retail)       [phone]       [place], [place]  [address]       Ph: [phone]       Fax: [phone]   RxID:   [id number]    Orders Added: 1)  TLB-CBC Platelet - w/Differential [85025-CBCD] 2)  TLB-A1C / Hgb A1C (Glycohemoglobin) [62376-E8B]

## 2010-06-29 NOTE — Letter (Signed)
December 31, 2007    [employer]  Attention:  [facility] Appeals and Grievances  [address]  [place], [place] [address]   RE:  SELA, FALK  MRN:  [id number]  /  DOB:  07-06-1935   Dear Londell Moh:   This letter concerns our patient and your client, Ms. Trefz who has been  diagnosed with ulcerative colitis in the 1980s.  I have followed Ms.  Heckendorn since 2003 for predominantly left-sided ulcerative colitis.  She  was initially on sulfasalazine and subsequently on Asacol.  She has had  numerous flare-ups, usually controlled with oral prednisone.  Only  recently, we have switched her to Lialda 1.2 grams 2 tablets a day,  which has been tolerated very well by the patient.  She does not have  any of the side effects, which Asacol caused.  Unfortunately, the cost  has been quite prohibitive for her.  I have provided some samples to  her, but I would ask if you could work with her as far as providing  assistance for her.  I appreciate your help with this nice lady.    Sincerely,      Hedwig Morton. Juanda Chance, MD  Electronically Signed    DMB/MedQ  DD: 12/31/2007  DT: 01/01/2008  Job #: [id number]

## 2010-07-02 NOTE — Assessment & Plan Note (Signed)
[facility]                           GASTROENTEROLOGY OFFICE NOTE   KARISSA, MEENAN                        MRN:          [id number]  DATE:12/26/2005                            DOB:          03-22-1935    Ms. Woehrle is a 75 year old tall white female with ulcerative proctitis. Her  last colonoscopy March 25, 2003 showing ulcerative colitis left-sided,  moderately active from 0-60 cm confirmed on biopsies. She has done very well  since her last appointment in January of this year. Due to her travels to  [place] and [place] to see her children, she missed an appointment  but she is complete remission. Unfortunately, she was unable to take 6-  mercaptopurine 50 mg a day because of nausea, so she stayed with Canasa  suppositories and Asacol 400 mg three times a day which she has tolerated  very well. She is currently having regular bowel movements, no bleeding or  abdominal pain.   MEDICATIONS:  1. __________ 1/2 tablets b.i.d.  2. Asacol 400 mg p.o. t.i.d.  3. Effexor XR 75 mg daily.  4. Multivitamins.   She has not used Canasa suppositories recently.   PHYSICAL EXAMINATION:  VITAL SIGNS:  Blood pressure 132/84, pulse 84, weight  143 pounds.  GENERAL:  She was alert, oriented in no distress.  LUNGS:  Clear to auscultation.  COR:  Normal S1, normal S2.  ABDOMEN:  Soft, nontender with normal active bowel sounds.  RECTAL:  Shows normal rectal tone, there is Hemoccult negative stool.   IMPRESSION:  A 75 year old white female with left-sided ulcerative colitis  in remission currently asymptomatic.   PLAN:  1. Continue Canasa at 400 mg t.i.d.  2. Take Canasa suppositories on p.r.n. basis.  3. Sed rate and CBC today.  4. Office visit in 6 months.     Hedwig Morton. Juanda Chance, MD  Electronically Signed   DMB/MedQ  DD: 12/26/2005  DT: 12/26/2005  Job #: [id number]   cc:   Donia Guiles, M.D.

## 2010-09-13 ENCOUNTER — Other Ambulatory Visit: Payer: Self-pay | Admitting: Obstetrics and Gynecology

## 2010-09-13 DIAGNOSIS — Z1231 Encounter for screening mammogram for malignant neoplasm of breast: Secondary | ICD-10-CM

## 2010-10-27 ENCOUNTER — Encounter: Payer: Self-pay | Admitting: Gynecology

## 2010-10-27 DIAGNOSIS — N814 Uterovaginal prolapse, unspecified: Secondary | ICD-10-CM | POA: Insufficient documentation

## 2010-10-27 DIAGNOSIS — M8580 Other specified disorders of bone density and structure, unspecified site: Secondary | ICD-10-CM | POA: Insufficient documentation

## 2010-11-01 ENCOUNTER — Ambulatory Visit
Admission: RE | Admit: 2010-11-01 | Discharge: 2010-11-01 | Disposition: A | Discharge status: Home / Self Care | Payer: PRIVATE HEALTH INSURANCE | Source: Ambulatory Visit | Attending: Obstetrics and Gynecology | Admitting: Obstetrics and Gynecology

## 2010-11-01 DIAGNOSIS — Z1231 Encounter for screening mammogram for malignant neoplasm of breast: Secondary | ICD-10-CM

## 2010-11-09 ENCOUNTER — Ambulatory Visit (INDEPENDENT_AMBULATORY_CARE_PROVIDER_SITE_OTHER): Payer: PRIVATE HEALTH INSURANCE | Admitting: Obstetrics and Gynecology

## 2010-11-09 ENCOUNTER — Encounter: Payer: Self-pay | Admitting: Obstetrics and Gynecology

## 2010-11-09 VITALS — BP 124/76 | Ht 60.0 in | Wt 144.0 lb

## 2010-11-09 DIAGNOSIS — M899 Disorder of bone, unspecified: Secondary | ICD-10-CM

## 2010-11-09 DIAGNOSIS — N951 Menopausal and female climacteric states: Secondary | ICD-10-CM

## 2010-11-09 DIAGNOSIS — M8580 Other specified disorders of bone density and structure, unspecified site: Secondary | ICD-10-CM

## 2010-11-09 DIAGNOSIS — Z780 Asymptomatic menopausal state: Secondary | ICD-10-CM

## 2010-11-09 DIAGNOSIS — N952 Postmenopausal atrophic vaginitis: Secondary | ICD-10-CM

## 2010-11-09 DIAGNOSIS — N814 Uterovaginal prolapse, unspecified: Secondary | ICD-10-CM

## 2010-11-09 NOTE — Progress Notes (Signed)
  The patient came to see me today for further followup. She has uterine prolapse but really is just mildly symptomatic. She is having no vaginal bleeding. She is having no pelvic pain. She does have osteopenia but does not have an elevated FRAX risk. She's had no fractures. She takes calcium and vitamin D. Her menopausal symptoms are resolving. She does not need any intervention with HRT. She also is atrophic vaginitis but is fine without treatment. She is up-to-date on her mammograms.  Review of systems: 12 system reviewed. Positives include ulcerative colitis and possibly elevated potassium level. She also is being sent for an echo of her heart because of a heart murmur. All this is been resolved by her PCP. There are no other pertinent positives.  Physical examination: HEENT within normal limits. Neck: Thyroid not large. No masses. Supraclavicular nodes: not enlarged. Breasts: Examined in both sitting midline position. No skin changes and no masses. Abdomen: Soft no guarding rebound or masses or hernia. Pelvic: External: Within normal limits. BUS: Within normal limits. Vaginal:within normal limits. poor estrogen effect. No evidence of cystocele rectocele or enterocele. Cervix: clean. Uterus: Normal size and shape with one and a half degrees of uterine prolapse. Adnexa: No masses. Rectovaginal exam: Confirmatory and negative. Extremities: Within normal limits.  Assessment: #1. Uterine prolapse #2. Osteopenia #3. Atrophic vaginitis #4. Menopausal symptoms  Plan: Continue observation of all of the above. Continue yearly mammograms. Continue periodic bone densities.

## 2010-12-13 ENCOUNTER — Encounter: Payer: Self-pay | Admitting: Internal Medicine

## 2010-12-13 ENCOUNTER — Ambulatory Visit (INDEPENDENT_AMBULATORY_CARE_PROVIDER_SITE_OTHER): Payer: PRIVATE HEALTH INSURANCE | Admitting: Internal Medicine

## 2010-12-13 VITALS — BP 132/80 | HR 84 | Ht 62.99 in | Wt 156.0 lb

## 2010-12-13 DIAGNOSIS — K5150 Left sided colitis without complications: Secondary | ICD-10-CM

## 2010-12-13 DIAGNOSIS — R1013 Epigastric pain: Secondary | ICD-10-CM

## 2010-12-13 MED ORDER — MESALAMINE 1.2 G PO TBEC: DELAYED_RELEASE_TABLET | ORAL | Status: DC

## 2010-12-13 MED ORDER — HYDROCORTISONE 100 MG/60ML RE ENEM: ENEMA | RECTAL | Status: DC

## 2010-12-13 NOTE — Progress Notes (Signed)
Erika Fowler 09-27-1935 MRN [id number]    History of Present Illness:  This is a 75 year old white female with ulcerative colitis since 1980 predominantly in the left colon. Her last colonoscopy in 2008 was normal. She was in remission in August 2012  but has recently had a flareup after her trip to [place] to visit her son. She now is having blood per rectum, crampy abdominal pain and some diarrhea. She has been on Lialda 1.2 g twice a day. Her recent hemoglobin was 15. There is a family history of colon cancer in her mother. Her sister has inflammatory bowel disease.   Past Medical History  Diagnosis Date  . Colitis, ulcerative   . Osteopenia   . Uterine prolapse   . Anxiety    Past Surgical History  Procedure Date  . Tonsillectomy     reports that she has never smoked. She has never used smokeless tobacco. She reports that she drinks about 3.5 ounces of alcohol per week. Her drug history not on file. family history includes Breast cancer in her maternal aunt, mother, paternal aunt, and sister; Colon cancer in her mother; Heart disease in her mother; Hypertension in her sister; Inflammatory bowel disease in her sister; and Pancreatic cancer in her father. Allergies  Allergen Reactions  . Penicillins         Review of Systems: Denies upper GI symptoms except for occasional heartburn and indigestion  The remainder of the 10 point ROS is negative except as outlined in H&P   Physical Exam: General appearance  Well developed, in no distress. Eyes- non icteric. HEENT nontraumatic, normocephalic. Mouth no lesions, tongue papillated, no cheilosis. Neck supple without adenopathy, thyroid not enlarged, no carotid bruits, no JVD. Lungs Clear to auscultation bilaterally. Cor normal S1, normal S2, regular rhythm, no murmur,  quiet precordium. Abdomen: Mild tenderness left lower quadrant and epigastrium. Rectal: Hemoccult-positive stool. Extremities no pedal edema. Skin no  lesions. Neurological alert and oriented x 3. Psychological normal mood and affect.  Assessment and Plan:  Problem #1 Left sided ulcerative colitis with mild flareup. She will increase her mesalamine to 4.8 g daily and start cort enemas 3 times a week. I will see her in 8 weeks.  Problem #2 Epigastric discomfort. She has Pamine to take when necessary. Also, she will take OTC Pepcid, and continue Lexapro 10 mg daily.  Problem #3 Family history of colon cancer. She will be due for a colonoscopy in October 2013.   12/13/2010 [name]

## 2010-12-13 NOTE — Patient Instructions (Signed)
We have sent the following medications to your pharmacy for you to pick up at your convenience: Cort Enemas three times per week. Lialda 4 tablets daily. CC: Dr Lupita Raider

## 2011-01-28 ENCOUNTER — Other Ambulatory Visit: Payer: Self-pay | Admitting: Internal Medicine

## 2011-02-11 ENCOUNTER — Ambulatory Visit (INDEPENDENT_AMBULATORY_CARE_PROVIDER_SITE_OTHER): Payer: PRIVATE HEALTH INSURANCE | Admitting: Internal Medicine

## 2011-02-11 ENCOUNTER — Encounter: Payer: Self-pay | Admitting: Internal Medicine

## 2011-02-11 VITALS — BP 132/76 | HR 88 | Ht 60.0 in | Wt 145.0 lb

## 2011-02-11 DIAGNOSIS — K5100 Ulcerative (chronic) pancolitis without complications: Secondary | ICD-10-CM

## 2011-02-11 MED ORDER — ESCITALOPRAM OXALATE 10 MG PO TABS: 10.0000 mg | ORAL_TABLET | Freq: Every day | ORAL | Status: DC

## 2011-02-11 NOTE — Progress Notes (Signed)
Erika Fowler [birth year] MRN [id number]    History of Present Illness:  This is a 75 year old white female with left-sided ulcerative colitis. This is an eight-week followup since an exacerbation of her colitis in October after her visit to [place]. Today, she is asymptomatic. She increased her mesalamine to 4.8 g a day and used cort enemas daily and subsequently tapered them. She has been off cort enemas for several weeks. Her bowel habits are regular. She denies abdominal pain, rectal bleeding or diarrhea. There is a positive family history of colon cancer in her mother. She will be due for a recall colonoscopy in October 2013.   Past Medical History  Diagnosis Date  . Colitis, ulcerative   . Osteopenia   . Uterine prolapse   . Anxiety    Past Surgical History  Procedure Date  . Tonsillectomy     reports that she has never smoked. She has never used smokeless tobacco. She reports that she drinks about 3.5 ounces of alcohol per week. Her drug history not on file. family history includes Breast cancer in her maternal aunt, mother, paternal aunt, and sister; Colon cancer in her mother; Heart disease in her mother; Hypertension in her sister; Inflammatory bowel disease in her sister; and Pancreatic cancer in her father. Allergies  Allergen Reactions  . Penicillins         Review of Systems: Denies heartburn chest pain shortness of breath  The remainder of the 10 point ROS is negative except as outlined in H&P   Physical Exam: General appearance  Well developed, in no distress. Eyes- non icteric. HEENT nontraumatic, normocephalic. Mouth no lesions, tongue papillated, no cheilosis. Neck supple without adenopathy, thyroid not enlarged, no carotid bruits, no JVD. Lungs Clear to auscultation bilaterally. Cor normal S1, normal S2, regular rhythm, no murmur,  quiet precordium. Abdomen: Soft nontender abdomen with normal active bowel sounds. No distention. Liver edge at costal  margin. Rectal: Not done Extremities no pedal edema. Skin no lesions. Neurological alert and oriented x 3. Psychological normal mood and affect.  Assessment and Plan:  Problem #1 Left sided ulcerative colitis now in remission after a brief flareup. She will decrease her mesalamine to 2.4 g daily. She will continue on Lexapro. We will see her in 6 months for follow up. She will be due for a recall colonoscopy in October 2013.   02/11/2011 Erika Fowler

## 2011-02-11 NOTE — Patient Instructions (Signed)
We have sent the following medications to your pharmacy for you to pick up at your convenience: Lexapro Please follow up with Dr Juanda Chance in 6 months. CC: Dr Lupita Raider

## 2011-09-07 ENCOUNTER — Ambulatory Visit (INDEPENDENT_AMBULATORY_CARE_PROVIDER_SITE_OTHER): Payer: PRIVATE HEALTH INSURANCE | Admitting: Internal Medicine

## 2011-09-07 ENCOUNTER — Encounter: Payer: Self-pay | Admitting: Internal Medicine

## 2011-09-07 ENCOUNTER — Other Ambulatory Visit (INDEPENDENT_AMBULATORY_CARE_PROVIDER_SITE_OTHER): Payer: PRIVATE HEALTH INSURANCE

## 2011-09-07 VITALS — BP 130/76 | HR 80 | Wt 144.8 lb

## 2011-09-07 DIAGNOSIS — K5190 Ulcerative colitis, unspecified, without complications: Secondary | ICD-10-CM

## 2011-09-07 LAB — CBC WITH DIFFERENTIAL/PLATELET
Eosinophils Relative: 1.2 % (ref 0.0–5.0)
HCT: 45.6 % (ref 36.0–46.0)
Hemoglobin: 14.8 g/dL (ref 12.0–15.0)
Lymphocytes Relative: 27.5 % (ref 12.0–46.0)
Lymphs Abs: 2.1 10*3/uL (ref 0.7–4.0)
Monocytes Relative: 6.8 % (ref 3.0–12.0)
Neutro Abs: 4.8 10*3/uL (ref 1.4–7.7)
WBC: 7.5 10*3/uL (ref 4.5–10.5)

## 2011-09-07 LAB — SEDIMENTATION RATE: Sed Rate: 14 mm/hr (ref 0–22)

## 2011-09-07 MED ORDER — HYDROCORTISONE 100 MG/60ML RE ENEM: 100.0000 mg | ENEMA | Freq: Every day | RECTAL | Status: DC

## 2011-09-07 MED ORDER — ESCITALOPRAM OXALATE 10 MG PO TABS: 10.0000 mg | ORAL_TABLET | Freq: Every day | ORAL | Status: DC

## 2011-09-07 MED ORDER — MESALAMINE 1.2 G PO TBEC: DELAYED_RELEASE_TABLET | ORAL | Status: DC

## 2011-09-07 NOTE — Patient Instructions (Addendum)
We have sent the following medications to your pharmacy for you to pick up at your convenience: Lexapro Lialda Cort Enema Your physician has requested that you go to the basement for the following lab work before leaving today: CBC, Sed Rate You will be due for a recall colonoscopy in 11/2011. We will send you a reminder in the mail when it gets closer to that time. CC: Dr Lupita Raider

## 2011-09-07 NOTE — Progress Notes (Signed)
Erika Fowler 1935/10/28 MRN [id number]   History of Present Illness:  This is a 77 year old white female with ulcerative colitis since 54. Her last office visit was in December 2012. She has been in remission since she returned from [place] in December 2012. She is on a maintenance dose of mesalamine 2.4 g a day. She used several cort enemas in May when she thought that her colitis was flaring up but she's currently doing well. There is a family history of colon cancer in her mother. Her last colonoscopy was in October 2008. A recall colonoscopy will be due in October 2013.    Past Medical History  Diagnosis Date  . Colitis, ulcerative   . Osteopenia   . Uterine prolapse   . Anxiety    Past Surgical History  Procedure Date  . Tonsillectomy     reports that she has never smoked. She has never used smokeless tobacco. She reports that she drinks about 3.5 ounces of alcohol per week. Her drug history not on file. family history includes Breast cancer in her maternal aunt, mother, paternal aunt, and sister; Colon cancer in her mother; Heart disease in her mother; Hypertension in her sister; Inflammatory bowel disease in her sister; and Pancreatic cancer in her father. Allergies  Allergen Reactions  . Penicillins         Review of Systems: Denies abdominal pain rectal bleeding  The remainder of the 10 point ROS is negative except as outlined in H&P   Physical Exam: General appearance  Well developed, in no distress. Eyes- non icteric. HEENT nontraumatic, normocephalic. Mouth no lesions, tongue papillated, no cheilosis. Neck supple without adenopathy, thyroid not enlarged, no carotid bruits, no JVD. Lungs Clear to auscultation bilaterally. Cor normal S1, normal S2, regular rhythm, no murmur,  quiet precordium. Abdomen: Soft, nontender with normoactive bowel sounds. No distention. Rectal: Not done. Extremities no pedal edema. Skin no lesions. Neurological alert and  oriented x 3. Psychological normal mood and affect.  Assessment and Plan:  Problem #1 Ulcerative colitis in remission. Patient is to continue a maintenance dose of mesalamine. We will also give her refills for Lexapro. We will complete a CBC today. She will need an office visit in 6 months. A recall colonoscopy will be due in October 2013.   09/07/2011 [name]

## 2011-10-20 ENCOUNTER — Encounter: Payer: Self-pay | Admitting: Internal Medicine

## 2011-10-31 ENCOUNTER — Other Ambulatory Visit: Payer: Self-pay | Admitting: Family Medicine

## 2011-10-31 DIAGNOSIS — N951 Menopausal and female climacteric states: Secondary | ICD-10-CM

## 2011-10-31 DIAGNOSIS — Z1231 Encounter for screening mammogram for malignant neoplasm of breast: Secondary | ICD-10-CM

## 2011-11-21 ENCOUNTER — Encounter: Payer: Self-pay | Admitting: Internal Medicine

## 2011-11-22 ENCOUNTER — Encounter: Payer: Self-pay | Admitting: Obstetrics and Gynecology

## 2011-11-22 ENCOUNTER — Ambulatory Visit (INDEPENDENT_AMBULATORY_CARE_PROVIDER_SITE_OTHER): Payer: PRIVATE HEALTH INSURANCE | Admitting: Obstetrics and Gynecology

## 2011-11-22 VITALS — BP 124/76 | Ht 60.0 in | Wt 146.0 lb

## 2011-11-22 DIAGNOSIS — M899 Disorder of bone, unspecified: Secondary | ICD-10-CM

## 2011-11-22 DIAGNOSIS — M8580 Other specified disorders of bone density and structure, unspecified site: Secondary | ICD-10-CM

## 2011-11-22 DIAGNOSIS — N393 Stress incontinence (female) (male): Secondary | ICD-10-CM

## 2011-11-22 DIAGNOSIS — N814 Uterovaginal prolapse, unspecified: Secondary | ICD-10-CM

## 2011-11-22 DIAGNOSIS — Z780 Asymptomatic menopausal state: Secondary | ICD-10-CM

## 2011-11-22 NOTE — Patient Instructions (Signed)
Schedule appointment at the cancer center for genetic counseling for BRCA1 and BRCA2. Mammogram and  bone density.

## 2011-11-22 NOTE — Progress Notes (Signed)
Patient came to see me today for further followup. We are watching her with uterine prolapse and mild urinary stress incontinence. Although she is aware of her symptoms she does not feel they are  Intolerable enough  to have surgery. She is having no vaginal bleeding. She is having no pelvic pain. She continues to have hot flashes but does not want to reinitiate hormone replacement therapy. She does have osteopenia without an elevated fracture risk. She is scheduled a yearly mammogram and followup bone density. She has always had normal Pap smears. Her last Pap smear was 2011. She has a mother who had early onset breast cancer. She has 2 sisters and a maternal aunt who had breast cancer after  the age of 71. She has no family history of ovarian cancer.  ROS: 12 system review done. Pertinent positives above. Other positives are anxiety, ulcerative colitis.  Physical examination:Erika Fowler present.HEENT within normal limits. Neck: Thyroid not large. No masses. Supraclavicular nodes: not enlarged. Breasts: Examined in both sitting and lying  position. No skin changes and no masses. Abdomen: Soft no guarding rebound or masses or hernia. Pelvic: External: Within normal limits. BUS: Within normal limits. Vaginal:within normal limits. Good estrogen effect. No evidence of cystocele rectocele or enterocele. Cervix: clean. Uterus: Normal size and shape with  second-degree prolapse. Adnexa: No masses. Rectovaginal exam: Confirmatory and negative. Extremities: Within normal limits.  Assessment: #1. Menopausal symptoms #2. Osteopenia #3. Uterine prolapse #4. Mother with early onset breast cancer.  Plan: Mammogram and  bone density. Observation of uterine prolapse. Appointment at the cancer center for genetic counseling. Pap not done.The new Pap smear guidelines were discussed with the patient.

## 2011-11-24 ENCOUNTER — Ambulatory Visit
Admission: RE | Admit: 2011-11-24 | Discharge: 2011-11-24 | Disposition: A | Discharge status: Home / Self Care | Payer: Medicare Other | Source: Ambulatory Visit | Attending: Family Medicine | Admitting: Family Medicine

## 2011-11-24 DIAGNOSIS — Z1231 Encounter for screening mammogram for malignant neoplasm of breast: Secondary | ICD-10-CM

## 2011-11-24 DIAGNOSIS — N951 Menopausal and female climacteric states: Secondary | ICD-10-CM

## 2011-11-28 ENCOUNTER — Encounter: Payer: Self-pay | Admitting: Obstetrics and Gynecology

## 2012-01-17 ENCOUNTER — Encounter: Payer: PRIVATE HEALTH INSURANCE | Admitting: Internal Medicine

## 2012-01-31 ENCOUNTER — Encounter: Payer: Self-pay | Admitting: Internal Medicine

## 2012-02-28 ENCOUNTER — Ambulatory Visit (AMBULATORY_SURGERY_CENTER): Payer: Medicare Other | Admitting: *Deleted

## 2012-02-28 VITALS — Ht 60.0 in | Wt 150.0 lb

## 2012-02-28 DIAGNOSIS — K5190 Ulcerative colitis, unspecified, without complications: Secondary | ICD-10-CM

## 2012-02-28 MED ORDER — MOVIPREP 100 G PO SOLR: ORAL | Status: DC

## 2012-03-13 ENCOUNTER — Encounter: Payer: Self-pay | Admitting: Internal Medicine

## 2012-03-13 ENCOUNTER — Ambulatory Visit (AMBULATORY_SURGERY_CENTER): Payer: Medicare Other | Admitting: Internal Medicine

## 2012-03-13 VITALS — BP 126/66 | HR 68 | Temp 97.1°F | Resp 30 | Ht 60.0 in | Wt 146.0 lb

## 2012-03-13 DIAGNOSIS — D126 Benign neoplasm of colon, unspecified: Secondary | ICD-10-CM

## 2012-03-13 DIAGNOSIS — K5289 Other specified noninfective gastroenteritis and colitis: Secondary | ICD-10-CM

## 2012-03-13 DIAGNOSIS — K5190 Ulcerative colitis, unspecified, without complications: Secondary | ICD-10-CM

## 2012-03-13 MED ORDER — MESALAMINE 1000 MG RE SUPP: 1000.0000 mg | Freq: Every day | RECTAL | Status: DC

## 2012-03-13 MED ORDER — SODIUM CHLORIDE 0.9 % IV SOLN: 500.0000 mL | INTRAVENOUS | Status: DC

## 2012-03-13 NOTE — Progress Notes (Signed)
Lidocaine-40mg IV prior to Propofol InductionPropofol given over incremental dosages 

## 2012-03-13 NOTE — Progress Notes (Addendum)
Patient did not have preoperative order for IV antibiotic SSI prophylaxis. (G8918)  Patient did not experience any of the following events: a burn prior to discharge; a fall within the facility; wrong site/side/patient/procedure/implant event; or a hospital transfer or hospital admission upon discharge from the facility. (G8907)  

## 2012-03-13 NOTE — Op Note (Signed)
 [facility] [address] [place], [address]   COLONOSCOPY PROCEDURE REPORT  PATIENT: Erika Fowler, Erika Fowler  MR#: [id number] BIRTHDATE: [birth year] , 76  yrs. old GENDER: Female ENDOSCOPIST: Hart Carwin, MD REFERRED BY:  Lupita Raider, M.D. PROCEDURE DATE:  03/13/2012 PROCEDURE:   Colonoscopy, surveillance ASA CLASS:   Class II INDICATIONS:Screening and surveillance,personal history of colonic polyps and High risk patient < left sided UC since 1980, huperplastic polyp 2008, MEDICATIONS: MAC sedation, administered by CRNA and propofol (Diprivan) 150mg  IV  DESCRIPTION OF PROCEDURE:   After the risks and benefits and of the procedure were explained, informed consent was obtained.  A digital rectal exam revealed no abnormalities of the rectum.    The LB [id number]-[id number]  endoscope was introduced through the anus and advanced to the cecum, which was identified by both the appendix and ileocecal valve .  The quality of the prep was excellent, using MoviPrep .  The instrument was then slowly withdrawn as the colon was fully examined.   active colitis 0-30 cm, characterized by granularity, some contact bleeding, edema with loss of vascular pattern, no skipped lesions,or pseudopolyps, normal appearing descending colon and right colon to the cecum- random biopsies taken from the R,transv and L colon          Retroflexed views revealed no abnormalities. The scope was then withdrawn from the patient and the procedure completed.  COMPLICATIONS: There were no complications. ENDOSCOPIC IMPRESSION:  avtive ulcerative colitis, mild to moderate from 0-30 cm ( sigmoid colon), s/p random biopsies to r/o dysplasia right and transverse colon appear normal  RECOMMENDATIONS: 1.  Await biopsy results 2.   continue Lialda 4.8 gm/day 3. consider Canasa supp 1000mg  hs, she has not been having any symptoms from her colitis   REPEAT EXAM: for Colonoscopy. 3-5  years  cc:  _______________________________ eSignedHart Carwin, MD 03/13/2012 10:12 AM     PATIENT NAME:  [name], [name] MR#: [id number]

## 2012-03-13 NOTE — Progress Notes (Signed)
Called to room to assist during endoscopic procedure.  Patient ID and intended procedure confirmed with present staff. Received instructions for my participation in the procedure from the performing physician.  

## 2012-03-13 NOTE — Patient Instructions (Addendum)

## 2012-03-14 ENCOUNTER — Telehealth: Payer: Self-pay | Admitting: *Deleted

## 2012-03-14 NOTE — Telephone Encounter (Signed)
  Follow up Call-  Call back number 03/13/2012  Post procedure Call Back phone  # [phone]  Permission to leave phone message Yes     Patient questions:  Do you have a fever, pain , or abdominal swelling? no Pain Score  0 *  Have you tolerated food without any problems? yes  Have you been able to return to your normal activities? yes  Do you have any questions about your discharge instructions: Diet   no Medications  no Follow up visit  no  Do you have questions or concerns about your Care? no  Actions: * If pain score is 4 or above: No action needed, pain <4.

## 2012-03-19 ENCOUNTER — Encounter: Payer: Self-pay | Admitting: Internal Medicine

## 2012-05-15 DIAGNOSIS — H349 Unspecified retinal vascular occlusion: Secondary | ICD-10-CM

## 2012-05-15 HISTORY — DX: Unspecified retinal vascular occlusion: H34.9

## 2012-05-18 ENCOUNTER — Ambulatory Visit: Payer: Medicare Other | Admitting: Internal Medicine

## 2012-05-25 ENCOUNTER — Ambulatory Visit: Payer: Medicare Other | Admitting: Internal Medicine

## 2012-07-10 ENCOUNTER — Encounter: Payer: Self-pay | Admitting: Internal Medicine

## 2012-07-10 ENCOUNTER — Ambulatory Visit (INDEPENDENT_AMBULATORY_CARE_PROVIDER_SITE_OTHER): Payer: Medicare Other | Admitting: Internal Medicine

## 2012-07-10 VITALS — BP 120/80 | HR 60 | Ht 60.0 in | Wt 146.6 lb

## 2012-07-10 DIAGNOSIS — K5100 Ulcerative (chronic) pancolitis without complications: Secondary | ICD-10-CM

## 2012-07-10 MED ORDER — ESCITALOPRAM OXALATE 10 MG PO TABS: 10.0000 mg | ORAL_TABLET | Freq: Every day | ORAL | Status: DC

## 2012-07-10 NOTE — Progress Notes (Signed)
Erika Fowler July 25, 1935 MRN [id number]        History of Present Illness:  This is a 77 year old white female with a left-sided ulcerative colitis since 1980s, status post recent colonoscopy on 03/13/2012 which showed mild active colitis from 0-30 cm and  chronic changes from 40-60 cm. We have increased her Lialda to 4.8 g daily but she still takes only 2 a day which is 2.4 g. She denies any symptoms such as rectal bleeding, diarrhea or abdominal pain. She has a history of hyperplastic polyp on colonoscopy in 2008.   Past Medical History  Diagnosis Date  . Colitis, ulcerative   . Osteopenia   . Uterine prolapse   . Anxiety   . 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 Tonsillectomy      reports that she has never smoked. She has never used smokeless tobacco. She reports that she drinks about 3.5 ounces of alcohol per week. She reports that she does not use illicit drugs. family history includes Breast cancer in her maternal aunts, mother, paternal aunt, and sister; Colon cancer (age of onset: 69) in her mother; Heart disease in her mother; Hypertension in her sister; Inflammatory bowel disease in her sister; and Pancreatic cancer in her father.  There is no history of Stomach cancer. Allergies  Allergen Reactions  . Penicillins     Per pt: unknown        Review of Systems: Denies abdominal pain rectal bleeding  The remainder of the 10 point ROS is negative except as outlined in H&P   Physical Exam: General appearance  Well developed, in no distress. Eyes- non icteric. HEENT nontraumatic, normocephalic. Mouth no lesions, tongue papillated, no cheilosis. Neck supple without adenopathy, thyroid not enlarged, no carotid bruits, no JVD. Lungs Clear to auscultation bilaterally. Cor normal S1, normal S2, regular rhythm, no murmur,  quiet precordium. Abdomen: Soft relaxed abdomen with normoactive bowel sounds. No  distention. Liver edge at costal margin Rectal: Not done Extremities no pedal edema. Skin no lesions. Neurological alert and oriented x 3. Psychological normal mood and affect.  Assessment and Plan:  77 year old white female with left sided mild ulcerative colitis of 30 years duration, currently asymptomatic. She will continue on current dose of the mesalamine 2.4 g daily. We will refill her Lexapro 10 mg daily and I will see her in 6 months for follow up, we will next time check  bone density as well as give Pneumovax as per IBD excellence guidelines.   07/10/2012 [name]

## 2012-07-10 NOTE — Patient Instructions (Addendum)
We have sent the following medications to your pharmacy for you to pick up at your convenience: Lexapro. Dr Philipp Deputy

## 2012-10-23 ENCOUNTER — Other Ambulatory Visit: Payer: Self-pay

## 2012-10-23 DIAGNOSIS — Z1231 Encounter for screening mammogram for malignant neoplasm of breast: Secondary | ICD-10-CM

## 2012-10-23 DIAGNOSIS — Z803 Family history of malignant neoplasm of breast: Secondary | ICD-10-CM

## 2012-11-14 ENCOUNTER — Other Ambulatory Visit: Payer: Self-pay | Admitting: *Deleted

## 2012-11-14 MED ORDER — MESALAMINE 1.2 G PO TBEC: DELAYED_RELEASE_TABLET | ORAL | Status: DC

## 2012-11-22 ENCOUNTER — Encounter: Payer: Self-pay | Admitting: Gynecology

## 2012-11-27 ENCOUNTER — Ambulatory Visit: Payer: Medicare Other

## 2012-11-27 ENCOUNTER — Ambulatory Visit
Admission: RE | Admit: 2012-11-27 | Discharge: 2012-11-27 | Disposition: A | Discharge status: Home / Self Care | Payer: Medicare Other | Source: Ambulatory Visit

## 2012-11-27 DIAGNOSIS — Z803 Family history of malignant neoplasm of breast: Secondary | ICD-10-CM

## 2012-11-27 DIAGNOSIS — Z1231 Encounter for screening mammogram for malignant neoplasm of breast: Secondary | ICD-10-CM

## 2013-04-05 ENCOUNTER — Ambulatory Visit (INDEPENDENT_AMBULATORY_CARE_PROVIDER_SITE_OTHER): Payer: Medicare Other | Admitting: Internal Medicine

## 2013-04-05 ENCOUNTER — Other Ambulatory Visit (INDEPENDENT_AMBULATORY_CARE_PROVIDER_SITE_OTHER): Payer: Medicare Other

## 2013-04-05 ENCOUNTER — Encounter: Payer: Self-pay | Admitting: Internal Medicine

## 2013-04-05 VITALS — BP 122/84 | HR 80 | Ht 60.0 in | Wt 149.0 lb

## 2013-04-05 DIAGNOSIS — R1319 Other dysphagia: Secondary | ICD-10-CM

## 2013-04-05 DIAGNOSIS — K5190 Ulcerative colitis, unspecified, without complications: Secondary | ICD-10-CM

## 2013-04-05 LAB — CBC WITH DIFFERENTIAL/PLATELET
BASOS ABS: 0 10*3/uL (ref 0.0–0.1)
Basophils Relative: 0.3 % (ref 0.0–3.0)
EOS ABS: 0.1 10*3/uL (ref 0.0–0.7)
Eosinophils Relative: 1 % (ref 0.0–5.0)
HCT: 46.6 % — ABNORMAL HIGH (ref 36.0–46.0)
HEMOGLOBIN: 15.2 g/dL — AB (ref 12.0–15.0)
LYMPHS ABS: 2.3 10*3/uL (ref 0.7–4.0)
LYMPHS PCT: 26.6 % (ref 12.0–46.0)
MCHC: 32.6 g/dL (ref 30.0–36.0)
MCV: 93.9 fl (ref 78.0–100.0)
Monocytes Absolute: 0.5 10*3/uL (ref 0.1–1.0)
Monocytes Relative: 5.4 % (ref 3.0–12.0)
NEUTROS ABS: 5.7 10*3/uL (ref 1.4–7.7)
Neutrophils Relative %: 66.7 % (ref 43.0–77.0)
Platelets: 311 10*3/uL (ref 150.0–400.0)
RBC: 4.97 Mil/uL (ref 3.87–5.11)
RDW: 14.2 % (ref 11.5–14.6)
WBC: 8.6 10*3/uL (ref 4.5–10.5)

## 2013-04-05 LAB — COMPREHENSIVE METABOLIC PANEL
ALT: 28 U/L (ref 0–35)
AST: 22 U/L (ref 0–37)
Albumin: 4.3 g/dL (ref 3.5–5.2)
Alkaline Phosphatase: 70 U/L (ref 39–117)
BUN: 18 mg/dL (ref 6–23)
CALCIUM: 9.7 mg/dL (ref 8.4–10.5)
CHLORIDE: 104 meq/L (ref 96–112)
CO2: 27 mEq/L (ref 19–32)
CREATININE: 0.6 mg/dL (ref 0.4–1.2)
GFR: 101.55 mL/min (ref >60.00)
Glucose, Bld: 92 mg/dL (ref 70–99)
POTASSIUM: 4.1 meq/L (ref 3.5–5.1)
SODIUM: 139 meq/L (ref 135–145)
TOTAL PROTEIN: 7.6 g/dL (ref 6.0–8.3)
Total Bilirubin: 0.7 mg/dL (ref 0.3–1.2)

## 2013-04-05 LAB — SEDIMENTATION RATE: Sed Rate: 14 mm/hr (ref 0–22)

## 2013-04-05 MED ORDER — RANITIDINE HCL 150 MG PO TABS: 150.0000 mg | ORAL_TABLET | Freq: Every day | ORAL | Status: DC

## 2013-04-05 MED ORDER — MESALAMINE 1.2 G PO TBEC: DELAYED_RELEASE_TABLET | ORAL | Status: DC

## 2013-04-05 NOTE — Progress Notes (Signed)
Erika Fowler 1936/01/02 [id number]  Note: This dictation was prepared with Dragon digital system. Any transcriptional errors that result from this procedure are unintentional.   History of Present Illness: This is a 78 year old white female left with sided ulcerative colitis. Last office visit May 2014. Initial diagnosis made in 1980. Last colonoscopy in January 2014 showed mildly active colitis from 0-30 cm and chronic colitis from 40-60 cm. She is doing very well. Denies rectal bleeding or diarrhea. She takes mesalamine 2.4 g daily. Today she is complaining of globus sensation in her throat.. She denies true dysphagia but has had feeling of a lump in the back of her throat. Partially relieved by TUMS 4-5 / day.    Past Medical History  Diagnosis Date  . Colitis, ulcerative   . Osteopenia   . Uterine prolapse   . Anxiety   . 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 Tonsillectomy      Allergies  Allergen Reactions  . Penicillins     Per pt: unknown    Family history and social history have been reviewed.  Review of Systems: Negative for chest pain diarrhea rectal bleeding  The remainder of the 10 point ROS is negative except as outlined in the H&P  Physical Exam: General Appearance Well developed, in no distress Psychological Normal mood and affect  Assessment and Plan:   78 year old white female with the left-sided ulcerative colitis in remission. She will continue the same dose of mesalamine. She will return in 1 year. Check CBC , metabolic panel and sed rates today Globus sensation in the throat. May be related to  anxiety or to gastroesophageal reflux. We will start ranitidine 150 mg at bedtime, 3 refills. If symptoms don't subside the next 6-8 weeks we will proceed with barium esophagram to r/o dysmotility.    [name] 04/05/2013

## 2013-04-05 NOTE — Patient Instructions (Signed)
We have sent the following medications to your pharmacy for you to pick up at your convenience: Lialda Ranitidine  Your physician has requested that you go to the basement for the following lab work before leaving today: CBC, CMET, Sed Rate  Please follow up with Dr Olevia Perches in 1 year.  Cc:Dr Brigitte Pulse

## 2013-04-08 ENCOUNTER — Telehealth: Payer: Self-pay | Admitting: Internal Medicine

## 2013-04-08 NOTE — Telephone Encounter (Signed)
May switch to Apriso .375 gm, take 3tab/day all at once.

## 2013-04-08 NOTE — Telephone Encounter (Signed)
Dr Olevia Perches, patient's insurance will not cover Lialda any longer. Her insurance company told her that mesalamine would be covered!?!?! I advised that Lialda is mesalamine. Apparently, they will cover Apriso. Dr Olevia Perches, may I change patient to [facility] instead?

## 2013-04-08 NOTE — Telephone Encounter (Signed)
Patient advised to contact her insurance company to find out which mesalamine products they will cover and call us back with options. She verbalizes understanding.

## 2013-04-09 MED ORDER — MESALAMINE ER 0.375 G PO CP24: 1125.0000 mg | ORAL_CAPSULE | Freq: Every day | ORAL | Status: DC

## 2013-04-09 NOTE — Telephone Encounter (Signed)
Patient advised that rx for Apriso went through at pharmacy for $45.00. She verbalizes understanding.

## 2013-04-09 NOTE — Telephone Encounter (Signed)
Rx sent for Apriso.

## 2013-09-16 ENCOUNTER — Telehealth: Payer: Self-pay | Admitting: Internal Medicine

## 2013-09-16 NOTE — Telephone Encounter (Signed)
Spoke with patient and she states she started having blood in stool last week. She had company and thought it was just the stress. Continues to have blood in stool and is going to the bathroom urgently after eating. She is having cramping also. She is taking Apriso 3 tablets daily. Scheduled with Tye Savoy, NP on 09/18/13 at 3:00 PM.

## 2013-09-18 ENCOUNTER — Encounter: Payer: Self-pay | Admitting: Nurse Practitioner

## 2013-09-18 ENCOUNTER — Other Ambulatory Visit (INDEPENDENT_AMBULATORY_CARE_PROVIDER_SITE_OTHER): Payer: Medicare Other

## 2013-09-18 ENCOUNTER — Ambulatory Visit (INDEPENDENT_AMBULATORY_CARE_PROVIDER_SITE_OTHER): Payer: Medicare Other | Admitting: Nurse Practitioner

## 2013-09-18 VITALS — BP 122/80 | HR 66 | Ht 60.0 in | Wt 143.2 lb

## 2013-09-18 DIAGNOSIS — R197 Diarrhea, unspecified: Secondary | ICD-10-CM

## 2013-09-18 DIAGNOSIS — K5190 Ulcerative colitis, unspecified, without complications: Secondary | ICD-10-CM

## 2013-09-18 LAB — CBC WITH DIFFERENTIAL/PLATELET
BASOS ABS: 0 10*3/uL (ref 0.0–0.1)
Basophils Relative: 0.5 % (ref 0.0–3.0)
EOS ABS: 0.1 10*3/uL (ref 0.0–0.7)
Eosinophils Relative: 2 % (ref 0.0–5.0)
HCT: 44.4 % (ref 36.0–46.0)
Hemoglobin: 14.7 g/dL (ref 12.0–15.0)
LYMPHS PCT: 30.7 % (ref 12.0–46.0)
Lymphs Abs: 1.8 10*3/uL (ref 0.7–4.0)
MCHC: 33 g/dL (ref 30.0–36.0)
MCV: 92 fl (ref 78.0–100.0)
Monocytes Absolute: 0.4 10*3/uL (ref 0.1–1.0)
Monocytes Relative: 6.8 % (ref 3.0–12.0)
NEUTROS PCT: 60 % (ref 43.0–77.0)
Neutro Abs: 3.6 10*3/uL (ref 1.4–7.7)
Platelets: 310 10*3/uL (ref 150.0–400.0)
RBC: 4.82 Mil/uL (ref 3.87–5.11)
RDW: 14.6 % (ref 11.5–15.5)
WBC: 5.9 10*3/uL (ref 4.0–10.5)

## 2013-09-18 LAB — SEDIMENTATION RATE: Sed Rate: 22 mm/hr (ref 0–22)

## 2013-09-18 LAB — C-REACTIVE PROTEIN: CRP: 0.6 mg/dL (ref 0.5–20.0)

## 2013-09-18 NOTE — Progress Notes (Signed)
     History of Present Illness:  Patient is a 78 year old female known to Dr. Olevia Perches for history of long-standing left-sided ulcerative colitis. Her last colonoscopy January 2014 revealed mild chronic active colitis from 0-30 cm and mild chronic changes from 40-60 cm. Right colon biopsies normal. At her last visit in February of this year patient was doing relatively well. Over the last several weeks she has had a few episodes of blood in stool. Last week developed some bowel changes and diffuse lower abdominal discomfort. Patient had been to [facility] which was associated with some stress. . She is currently maintained on Apriso 0.$RemoveBef'3 7 5 'eIfGEQVAUr$ mg 3 tablets daily.  Current Medications, Allergies, Past Medical History, Past Surgical History, Family History and Social History were reviewed in [employer] record.  Physical Exam: General: Pleasant, well developed , white female in no acute distress Head: Normocephalic and atraumatic Eyes:  sclerae anicteric, conjunctiva pink  Ears: Normal auditory acuity Lungs: Clear throughout to auscultation Heart: Regular rate and rhythm Abdomen: Soft, non distended, non-tender. No masses, no hepatomegaly. Normal bowel sounds Musculoskeletal: Symmetrical with no gross deformities  Extremities: No edema  Neurological: Alert oriented x 4, grossly nonfocal Psychological:  Alert and cooperative. Normal mood and affect  Assessment and Recommendations:  Pleasant 78 year old female with long-standing left-sided ulcerative colitis maintained on mesalamine. Here with a three-week history of intermittent blood in stool. Last week she developed increased frequency of semi-soft stools with blood and diffuse lower abdominal nagging discomfort. Patient has just returned from [facility] which was was somewhat of a stressful trip and she wonders if stress contributed to GI symptoms.   It is hard to know whether patient is having UC flare. Her bleeding has  almost resolved, abdominal exam is not concerning.  Will check a CBC, ESR and CRP.   Recent antibiotics for UTI. Although no diarrhea there has been an increase in frequency of stools so will check stool for C. Difficile  Will call patient with test results. She will give [place] a condition update at that time. Depending on labs and her clinical course we may need to add hydrocortisone enemas.

## 2013-09-18 NOTE — Patient Instructions (Signed)
Go to the basement for labs today We will contact you with those results

## 2013-09-19 ENCOUNTER — Other Ambulatory Visit: Payer: Medicare Other

## 2013-09-19 DIAGNOSIS — R197 Diarrhea, unspecified: Secondary | ICD-10-CM

## 2013-09-19 DIAGNOSIS — K5190 Ulcerative colitis, unspecified, without complications: Secondary | ICD-10-CM

## 2013-09-20 ENCOUNTER — Telehealth: Payer: Self-pay | Admitting: *Deleted

## 2013-09-20 LAB — CLOSTRIDIUM DIFFICILE BY PCR: CDIFFPCR: NOT DETECTED

## 2013-09-20 MED ORDER — MESALAMINE ER 0.375 G PO CP24: ORAL_CAPSULE | ORAL | Status: DC

## 2013-09-20 NOTE — Telephone Encounter (Signed)
Patient is still having cramping and pain. She will increase the Apriso. New rx sent.

## 2013-09-20 NOTE — Telephone Encounter (Signed)
Unable to reach patient. Line busy.

## 2013-09-20 NOTE — Telephone Encounter (Signed)
Message copied by Hulan Saas on Fri Sep 20, 2013  4:39 PM ------      Message from: Willia Craze      Created: Fri Sep 20, 2013  4:16 PM       Rollene Fare, Dr. Olevia Perches knows this patient quite well. Please refer to the addendum on my office note. Dr. Olevia Perches thinks she may be having a flare. If I recall correctly, her labs were all normal, patient thought this might be just stress related. Would you please call the patient and if not doing much better then increase her medication as suggested by Dr. Olevia Perches. Thank you.            ----- Message -----         From: Laban Emperor. Zehr, PA-C         Sent: 09/20/2013  12:55 PM           To: Willia Craze, NP            Sent to me in error.            ----- Message -----         From: [name], MD         Sent: 09/20/2013  12:11 PM           To: Laban Emperor. Zehr, PA-C                        ----- Message -----         From: Willia Craze, NP         Sent: 09/19/2013   9:32 AM           To: [name], MD                         ------

## 2013-09-20 NOTE — Progress Notes (Signed)
Reviewed.I think she is having mild flare-up. Please increase Apriso  From 375 mg tid ?? To 750 mg po tid, x 2weeks.May need an early refill on Apriso.

## 2013-10-07 ENCOUNTER — Telehealth: Payer: Self-pay | Admitting: Nurse Practitioner

## 2013-10-07 NOTE — Telephone Encounter (Signed)
Spoke with patient and she states she saw Tye Savoy, NP on 09/18/13. Her medication was increased to 6 pills/day. She continues to have bleeding, a lot of pain and has to go to the bathroom after eating. States she is even getting up at night to go to the bathroom. Please, advise

## 2013-10-08 ENCOUNTER — Telehealth: Payer: Self-pay | Admitting: *Deleted

## 2013-10-08 MED ORDER — BUDESONIDE 3 MG PO CP24: 9.0000 mg | ORAL_CAPSULE | Freq: Every day | ORAL | Status: DC

## 2013-10-08 NOTE — Telephone Encounter (Signed)
Patient calling again today to see what she should do now about symptoms not improving. Please, advise.

## 2013-10-08 NOTE — Telephone Encounter (Signed)
I have spoken to Erika Fowler, she is having flare up of her colitis. I have sent Entecort 3mg , #90, 3 po qd , 3 refills. She will call with an update within a week.

## 2013-10-08 NOTE — Telephone Encounter (Signed)
Rollene Fare, I  Spoke to Dr. Olevia Perches who knows patient well. She recommends increasing Mesalamine. I see she is on Apriso, 3 tablets daily. We can increase to 4 /day and see if this helps. If not then we may have to try a short course of Prednisone. Thanks

## 2013-10-09 NOTE — Telephone Encounter (Signed)
Dr. Olevia Perches spoke with patient.

## 2013-11-05 ENCOUNTER — Other Ambulatory Visit: Payer: Self-pay | Admitting: *Deleted

## 2013-11-05 MED ORDER — RANITIDINE HCL 150 MG PO TABS: 150.0000 mg | ORAL_TABLET | Freq: Every day | ORAL | Status: DC

## 2013-11-07 ENCOUNTER — Other Ambulatory Visit: Payer: Self-pay | Admitting: Family Medicine

## 2013-11-07 DIAGNOSIS — M8580 Other specified disorders of bone density and structure, unspecified site: Secondary | ICD-10-CM

## 2013-11-07 DIAGNOSIS — Z1231 Encounter for screening mammogram for malignant neoplasm of breast: Secondary | ICD-10-CM

## 2013-12-03 ENCOUNTER — Ambulatory Visit
Admission: RE | Admit: 2013-12-03 | Discharge: 2013-12-03 | Disposition: A | Discharge status: Home / Self Care | Payer: Medicare Other | Source: Ambulatory Visit | Attending: Family Medicine | Admitting: Family Medicine

## 2013-12-03 DIAGNOSIS — Z1231 Encounter for screening mammogram for malignant neoplasm of breast: Secondary | ICD-10-CM

## 2013-12-03 DIAGNOSIS — M8580 Other specified disorders of bone density and structure, unspecified site: Secondary | ICD-10-CM

## 2013-12-10 ENCOUNTER — Telehealth: Payer: Self-pay | Admitting: Internal Medicine

## 2013-12-10 ENCOUNTER — Encounter: Payer: Self-pay | Admitting: Nurse Practitioner

## 2013-12-10 ENCOUNTER — Ambulatory Visit (INDEPENDENT_AMBULATORY_CARE_PROVIDER_SITE_OTHER): Payer: Medicare Other | Admitting: Nurse Practitioner

## 2013-12-10 VITALS — BP 110/64 | HR 64 | Ht 59.0 in | Wt 138.1 lb

## 2013-12-10 DIAGNOSIS — K51911 Ulcerative colitis, unspecified with rectal bleeding: Secondary | ICD-10-CM

## 2013-12-10 MED ORDER — MESALAMINE 1000 MG RE SUPP: 1000.0000 mg | Freq: Two times a day (BID) | RECTAL | Status: DC

## 2013-12-10 NOTE — Patient Instructions (Signed)
WE sent a prescription for Canasa suppositories to [facility]. Continue Entorcort ( Budesonide)  Restart the Apriso Mesalamine.  Take Miralax  1-2 times daily until bowels moving.   Call Tye Savoy NP's office [phone] in 7-10-days.

## 2013-12-10 NOTE — Telephone Encounter (Signed)
Pt states she has been taking Entocort for a little over a month and for a couple of weeks she has been having blood and mucous in her stool. At first she thought it was hemorrhoids but now she states it is in her stool. States that as soon as she eats she has to go to the bathroom and she is having abdominal cramping. Pt scheduled to see Tye Savoy NP today at 3pm. Pt aware of appt.

## 2013-12-11 ENCOUNTER — Telehealth: Payer: Self-pay | Admitting: Nurse Practitioner

## 2013-12-11 ENCOUNTER — Encounter: Payer: Self-pay | Admitting: Nurse Practitioner

## 2013-12-11 MED ORDER — HYDROCORTISONE 100 MG/60ML RE ENEM: 1.0000 | ENEMA | Freq: Every day | RECTAL | Status: DC

## 2013-12-11 NOTE — Telephone Encounter (Signed)
I called the patient to advise we sent, per Berline Lopes to [employer]. # 30, use 1 at bedtime. She said she had used them before and they really helped. Also we made her a 2 month follow up with Dr. Olevia Perches  On 02-12-2014 at 9:00 am .

## 2013-12-11 NOTE — Addendum Note (Signed)
Addended byTonette Bihari on: 12/11/2013 04:34 PM   Modules accepted: Orders

## 2013-12-11 NOTE — Progress Notes (Signed)
     History of Present Illness:  Patient is a 78 year old female known to Dr. Olevia Perches for history of long-standing left-sided ulcerative colitis. Her last surveillance colonoscopy was done January 2014 with findings of active colitis, mild to moderate from 0-30 cm. Right colon biopsies were negative. Descending colon biopsies compatible with mild chronic changes. Sigmoid colon biopsies compatible with chronic, mildly active colitis. I saw patient earlier August for a three-week history of intermittent rectal bleeding and increased frequency of stools.  Patient had been under a lot of stress, it was not clear if she was having a UC flare. Her white count,  Hemoglobin, and ESR were normal. C. difficile was negative. Patient called the office a couple of days later with  cramping, we increased her Mesalamine.  She called back a few weeks later with flare type symptoms and we started Entocort 9 mg daily.   Patient comes in today for evaluation of rectal bleeding. This bleeding is different than flare type symptoms. She has urge to defecate after meals but usually passes only a small amount of old bloody material. She has actually only had 3-4 bowel movements over the last two weeks. After patient does have a bowel movement she's noticed some burning in her lower abdomen. No fevers. No joint aches. Patient stopped mesalamine because she didn't know if it could be taken with the Entocort.   Current Medications, Allergies, Past Medical History, Past Surgical History, Family History and Social History were reviewed in [employer] record.  Physical Exam: General: Pleasant, well developed , white female in no acute distress Head: Normocephalic and atraumatic Eyes:  sclerae anicteric, conjunctiva pink  Ears: Normal auditory acuity Lungs: Clear throughout to auscultation Heart: Regular rate and rhythm Abdomen: Soft, non distended, non-tender. No masses, no hepatomegaly. Normal bowel  sounds Rectal: No obvious fissures, she does have external hemorrhoids. On anoscopy there were some mildly inflamed internal hemorrhoids. Also, the anal canal was erythematous.  Musculoskeletal: Symmetrical with no gross deformities  Extremities: No edema  Neurological: Alert oriented x 4, grossly nonfocal Psychological:  Alert and cooperative. Normal mood and affect  Assessment and Recommendations:  78 year old female with long-standing history of left-sided ulcerative colitis. On Entocort 46m daily for two months. Patient discontinued mesalamine when Entocort was started as she wasn't sure they could be taken together. Now with recent bowel changes. She is actually constipated having only had 3-4 bowel movements over the last couple of weeks. She has post-prandial urges to defecate but usually just passes old bloody material. On anoscopy the anal canal is somewhat erythematous, there are some mildly inflamed internal hemorrhoids.. The "old blood" could be from colitis/proctitis and/or hemorrhoids.  We need to get her bowel moving. Start MiraLAX 1-2 times a day  Continue Entocort. Will resume Ariso at previous dose. Trial of Canasa suppositories. If these are costly or insurance won't cover, patient will call back. Another option would be hydrocortisone enemas.  Will schedule patient for follow-up visit with Dr. BOlevia Perchessometime in the next couple of months. If symptoms don't improve she will certainly need to be seen sooner

## 2013-12-12 NOTE — Progress Notes (Signed)
Reviewed and agree.

## 2013-12-16 ENCOUNTER — Encounter: Payer: Self-pay | Admitting: Nurse Practitioner

## 2014-02-12 ENCOUNTER — Ambulatory Visit: Payer: Medicare Other | Admitting: Internal Medicine

## 2014-02-25 ENCOUNTER — Ambulatory Visit (INDEPENDENT_AMBULATORY_CARE_PROVIDER_SITE_OTHER): Payer: Medicare Other | Admitting: Internal Medicine

## 2014-02-25 ENCOUNTER — Encounter: Payer: Self-pay | Admitting: Internal Medicine

## 2014-02-25 VITALS — BP 138/78 | HR 64 | Ht 59.5 in | Wt 137.1 lb

## 2014-02-25 DIAGNOSIS — R197 Diarrhea, unspecified: Secondary | ICD-10-CM

## 2014-02-25 DIAGNOSIS — K51911 Ulcerative colitis, unspecified with rectal bleeding: Secondary | ICD-10-CM

## 2014-02-25 MED ORDER — PREDNISONE 10 MG PO TABS: ORAL_TABLET | ORAL | Status: DC

## 2014-02-25 MED ORDER — PRAMOXINE HCL 1 % RE FOAM: 1.0000 "application " | Freq: Every day | RECTAL | Status: DC

## 2014-02-25 NOTE — Progress Notes (Signed)
Erika Fowler 10-May-1935 [id number]  Note: This dictation was prepared with Dragon digital system. Any transcriptional errors that result from this procedure are unintentional.   History of Present Illness: This is a 79 year old white female with left-sided ulcerative colitis which  flared up in October 2015 and initially responded to budesonide but has  flared up again after stopping the budesonide. She is only on Apriso 0.375 mg  2 tablets 3 times a day. She sees blood intermittently and she has a loose stools during the day as well as at night. She has had several episodes of urgency and incontinence. Last colonoscopy in January 2014 showed left-sided colitis from 0-30 cm showing chronic active colitis. She had tried to use Canasa suppositories but the cost of was $900. So we sent Cort enemas instead which she has tried but recently has not been able to retain them     Past Medical History  Diagnosis Date  . Colitis, ulcerative   . Osteopenia   . Uterine prolapse   . Anxiety   . 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 Tonsillectomy      Allergies  Allergen Reactions  . Penicillins     Per pt: unknown    Family history and social history have been reviewed.  Review of Systems: Crampy abdominal pain. Diarrhea. Rectal bleeding urgency  The remainder of the 10 point ROS is negative except as outlined in the H&P  Physical Exam: General Appearance Well developed, in no distress Eyes  Non icteric  HEENT  Non traumatic, normocephalic  Mouth No lesion, tongue papillated, no cheilosis Neck Supple without adenopathy, thyroid not enlarged, no carotid bruits, no JVD Lungs Clear to auscultation bilaterally COR Normal S1, normal S2, regular rhythm, no murmur, quiet precordium Abdomen soft with mild tenderness in left lower quadrant. Rectal and anoscopic exam revealed liquid stool all with pinkish tinge which is Hemoccult  positive. Normal perianal area normal rectal sphincter tone. Hyperemic rectal mucosa Extremities  No pedal edema Skin No lesions Neurological Alert and oriented x 3 Psychological Normal mood and affect  Assessment and Plan:   Active left-sided ulcerative colitis which initially responded to budesonide. She will start prednisone 30 mg daily for 2 weeks and slowly taper down by 5 mg every 2 weeks. She will continue on mesalamine and we will add Proctofoam one application  at bedtime. I will see her in 6-8 weeks and reassess;  consider flexible sigmoidoscopy if symptoms continue    [name] 02/25/2014

## 2014-02-25 NOTE — Patient Instructions (Signed)
Prednisone Taper:  Take 3 tablets everyday for 2 weeks Take 2.5 tablets everyday for 2 weeks Take 2 tablets everyday for 2 weeks Take 1.5 tablets everyday for 2 weeks Take 1 tablet by mouth everyday for 2 weeks  Take .5 tablet everyday for 2 weeks Take .5 tablet every other day for 2 weeks Then Discontinue the Prednisone   Your Prescription has been sent to your pharmacy Your Follow up appointment is scheduled on 04/22/2014 at 8:15am

## 2014-03-04 ENCOUNTER — Other Ambulatory Visit: Payer: Self-pay | Admitting: *Deleted

## 2014-03-04 MED ORDER — RANITIDINE HCL 150 MG PO TABS: 150.0000 mg | ORAL_TABLET | Freq: Every day | ORAL | Status: DC

## 2014-04-22 ENCOUNTER — Encounter: Payer: Self-pay | Admitting: Internal Medicine

## 2014-04-22 ENCOUNTER — Ambulatory Visit (INDEPENDENT_AMBULATORY_CARE_PROVIDER_SITE_OTHER): Payer: Medicare Other | Admitting: Internal Medicine

## 2014-04-22 VITALS — BP 114/72 | HR 76 | Ht 59.5 in | Wt 139.2 lb

## 2014-04-22 DIAGNOSIS — K51911 Ulcerative colitis, unspecified with rectal bleeding: Secondary | ICD-10-CM

## 2014-04-22 MED ORDER — RANITIDINE HCL 150 MG PO TABS: 150.0000 mg | ORAL_TABLET | Freq: Two times a day (BID) | ORAL | Status: AC

## 2014-04-22 NOTE — Progress Notes (Signed)
Erika Fowler 1935/05/07 [id number]  Note: This dictation was prepared with Dragon digital system. Any transcriptional errors that result from this procedure are unintentional.   History of Present Illness: This is a 79 year old white female with the ulcerative colitis since 13s and recent flareup. Last appointment in January 2016. She was put on prednisone taper starting at 30 mg a day and decreased by 5 mg every 2 weeks. She was able to discontinue prednisone one week ago. She is doing well. She denies diarrhea, abdominal pain or rectal bleeding. She is also on mesalamine. Apriso .375 mg 3 times a day. She no longer takes  Cort enemas. She was prior to that on Entecort but did not have good results. Last colonoscopy January 2014 showed moderately active left-sided colitis from 0-30 cm. Biopsies from the right colon showed normal mucosa and biopsies from the descending colon showed mild chronic changes. Next colonoscopy in January 2017    Past Medical History  Diagnosis Date  . Colitis, ulcerative   . Osteopenia   . Uterine prolapse   . Anxiety   . 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 Tonsillectomy      Allergies  Allergen Reactions  . Penicillins     Per pt: unknown    Family history and social history have been reviewed.  Review of Systems: Occasional heartburn and epigastric discomfort. Denies chest pain diarrhea or rectal bleeding  The remainder of the 10 point ROS is negative except as outlined in the H&P  Physical Exam: General Appearance Well developed, in no distress Eyes  Non icteric  HEENT  Non traumatic, normocephalic  Mouth No lesion, tongue papillated, no cheilosis Neck Supple without adenopathy, thyroid not enlarged, no carotid bruits, no JVD Lungs Clear to auscultation bilaterally COR Normal S1, normal S2, regular rhythm, no murmur, quiet precordium Abdomen soft with minimal tenderness in  left lower quadrant. Normal active bowel sounds. Rectal soft Hemoccult negative stool Extremities  No pedal edema Skin No lesions Neurological Alert and oriented x 3 Psychological Normal mood and affect  Assessment and Plan:  79 year old white female with ulcerative colitis of greater than 30 years duration. In remission after a flareup. She has responded to prednisone taper. She will maintain mesalamine. We'll see her in 6 months. Recall colonoscopy January 2017  Epigastric discomfort and dyspepsia. Increase ranitidine to 150 mg twice a day    Dr Derrill Memo [name] 04/22/2014

## 2014-04-22 NOTE — Patient Instructions (Addendum)
Your new prescription of twice a day ranitidine has been sent to your pharmacy Follow up in 6 months  Dr Derrill Memo

## 2014-05-10 ENCOUNTER — Emergency Department (HOSPITAL_BASED_OUTPATIENT_CLINIC_OR_DEPARTMENT_OTHER)
Admission: EM | Admit: 2014-05-10 | Discharge: 2014-05-10 | Disposition: A | Discharge status: Home / Self Care | Payer: Medicare Other | Attending: Emergency Medicine | Admitting: Emergency Medicine

## 2014-05-10 ENCOUNTER — Emergency Department (HOSPITAL_BASED_OUTPATIENT_CLINIC_OR_DEPARTMENT_OTHER): Payer: Medicare Other

## 2014-05-10 ENCOUNTER — Encounter (HOSPITAL_BASED_OUTPATIENT_CLINIC_OR_DEPARTMENT_OTHER): Payer: Self-pay

## 2014-05-10 DIAGNOSIS — F419 Anxiety disorder, unspecified: Secondary | ICD-10-CM | POA: Insufficient documentation

## 2014-05-10 DIAGNOSIS — Z880 Allergy status to penicillin: Secondary | ICD-10-CM | POA: Diagnosis not present

## 2014-05-10 DIAGNOSIS — M8580 Other specified disorders of bone density and structure, unspecified site: Secondary | ICD-10-CM | POA: Insufficient documentation

## 2014-05-10 DIAGNOSIS — Z87448 Personal history of other diseases of urinary system: Secondary | ICD-10-CM | POA: Insufficient documentation

## 2014-05-10 DIAGNOSIS — Z792 Long term (current) use of antibiotics: Secondary | ICD-10-CM | POA: Insufficient documentation

## 2014-05-10 DIAGNOSIS — Z8669 Personal history of other diseases of the nervous system and sense organs: Secondary | ICD-10-CM | POA: Insufficient documentation

## 2014-05-10 DIAGNOSIS — Z8719 Personal history of other diseases of the digestive system: Secondary | ICD-10-CM | POA: Insufficient documentation

## 2014-05-10 DIAGNOSIS — R609 Edema, unspecified: Secondary | ICD-10-CM

## 2014-05-10 DIAGNOSIS — L0390 Cellulitis, unspecified: Secondary | ICD-10-CM

## 2014-05-10 DIAGNOSIS — M7989 Other specified soft tissue disorders: Secondary | ICD-10-CM | POA: Diagnosis present

## 2014-05-10 DIAGNOSIS — Z7952 Long term (current) use of systemic steroids: Secondary | ICD-10-CM | POA: Diagnosis not present

## 2014-05-10 DIAGNOSIS — L03116 Cellulitis of left lower limb: Secondary | ICD-10-CM | POA: Diagnosis not present

## 2014-05-10 DIAGNOSIS — Z79899 Other long term (current) drug therapy: Secondary | ICD-10-CM | POA: Diagnosis not present

## 2014-05-10 HISTORY — DX: Cellulitis, unspecified: L03.90

## 2014-05-10 MED ORDER — CLINDAMYCIN HCL 150 MG PO CAPS: 450.0000 mg | ORAL_CAPSULE | Freq: Four times a day (QID) | ORAL | Status: DC

## 2014-05-10 MED ORDER — CLINDAMYCIN HCL 150 MG PO CAPS
450.0000 mg | ORAL_CAPSULE | Freq: Once | ORAL | Status: AC
  Administered 2014-05-10: 450 mg via ORAL
  Filled 2014-05-10: qty 3

## 2014-05-10 NOTE — ED Provider Notes (Signed)
CSN: [id number]     Arrival date & time 05/10/14  1358 History   First MD Initiated Contact with Patient 05/10/14 1420     Chief Complaint  Patient presents with  . Leg Swelling     (Consider location/radiation/quality/duration/timing/severity/associated sxs/prior Treatment) HPI   Erika Fowler is a 79 y.o. female 's accompanied by her daughter-in-law who supplies most of the history: States that patient and her husband been largely sedentary due to an upper respiratory infection with runny nose and sinusitis over the last 3 days, she noticed that her left leg is erythematous, warm and swollen. There is been no calf pain or swelling, chest pain, cough, shortness of breath, palpitations, presyncopal sensation, fever, chills. No history of DVT or PE. He does not recall any specific trauma to the left leg.  Past Medical History  Diagnosis Date  . Colitis, ulcerative   . Osteopenia   . Uterine prolapse   . Anxiety   . 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 Tonsillectomy     Family History  Problem Relation Age of Onset  . Colon cancer Mother 51  . Heart disease Mother   . Breast cancer Mother     Age 30's  . Pancreatic cancer Father   . Breast cancer Sister     Age 17's  . Hypertension Sister   . Breast cancer Maternal Aunt     Age 45's  . Breast cancer Paternal Aunt     Age 81's  . Inflammatory bowel disease Sister   . Breast cancer Maternal Aunt     Age 32's  . Stomach cancer Neg Hx    History  Substance Use Topics  . Smoking status: Never Smoker   . Smokeless tobacco: Never Used  . Alcohol Use: 3.5 oz/week    7 drink(s) per week   OB History    Gravida Para Term Preterm AB TAB SAB Ectopic Multiple Living   2 2 2       2      Review of Systems  10 systems reviewed and found to be negative, except as noted in the HPI.   Allergies  Penicillins  Home Medications   Prior to Admission medications     Medication Sig Start Date End Date Taking? Authorizing Provider  budesonide (ENTOCORT EC) 3 MG 24 hr capsule Take 3 capsules (9 mg total) by mouth daily. 10/08/13   [name], MD  Cholecalciferol (VITAMIN D) 2000 UNITS tablet Take 2,000 Units by mouth daily.      Historical Provider, MD  clindamycin (CLEOCIN) 150 MG capsule Take 3 capsules (450 mg total) by mouth 4 (four) times daily. 05/10/14   Karianna Gusman, PA-C  escitalopram (LEXAPRO) 10 MG tablet Take 1 tablet (10 mg total) by mouth daily. 07/10/12   [name], MD  hydrocortisone (CORTENEMA) 100 MG/60ML enema Place 1 enema (100 mg total) rectally at bedtime. 12/11/13   Willia Craze, NP  mesalamine (APRISO) 0.375 G 24 hr capsule Take 750 mg TID x 2 weeks then resume 375 mg TID. 09/20/13   [name], MD  mesalamine (CANASA) 1000 MG suppository Place 1 suppository (1,000 mg total) rectally 2 (two) times daily. 12/10/13   Willia Craze, NP  Multiple Vitamins-Minerals (PRESERVISION/LUTEIN PO) Take by mouth.      Historical Provider, MD  pramoxine (PROCTOFOAM) 1 % foam Place 1 application rectally at bedtime. 02/25/14   Sydell Axon  Andris Baumann, MD  raloxifene (EVISTA) 60 MG tablet Take 60 mg by mouth daily. 12/19/13   Historical Provider, MD  ranitidine (ZANTAC) 150 MG tablet Take 1 tablet (150 mg total) by mouth 2 (two) times daily. 04/22/14   [name], MD  tobramycin (TOBREX) 0.3 % ophthalmic solution Place 1 drop into the right eye as needed.  12/09/13   Historical Provider, MD   BP 175/77 mmHg  Pulse 69  Temp(Src) 97.5 F (36.4 C) (Oral)  Resp 18  Ht 5' (1.524 m)  Wt 135 lb (61.236 kg)  BMI 26.37 kg/m2  SpO2 99% Physical Exam  Constitutional: She is oriented to person, place, and time. She appears well-developed and well-nourished. No distress.  HENT:  Head: Normocephalic.  Eyes: Conjunctivae and EOM are normal.  Cardiovascular: Normal rate.   Pulmonary/Chest: Effort normal. No stridor.  Musculoskeletal: Normal range of  motion. She exhibits tenderness.       Legs: Area of cellulitis to the left ankle. She is swollen with pitting edema to the left foot. No calf asymmetry, superficial collaterals, palpable cords, Homans sign is negative bilaterally.  Neurological: She is alert and oriented to person, place, and time.  Psychiatric: She has a normal mood and affect.  Nursing note and vitals reviewed.   ED Course  Procedures (including critical care time) Labs Review Labs Reviewed - No data to display  Imaging Review US Venous Img Lower Unilateral Left  05/10/2014   CLINICAL DATA:  Patient with left medial lower calf/ankle pain. There is associated swelling.  EXAM: LEFT LOWER EXTREMITY VENOUS DOPPLER ULTRASOUND  TECHNIQUE: Gray-scale sonography with graded compression, as well as color Doppler and duplex ultrasound were performed to evaluate the lower extremity deep venous systems from the level of the common femoral vein and including the common femoral, femoral, profunda femoral, popliteal and calf veins including the posterior tibial, peroneal and gastrocnemius veins when visible. The superficial great saphenous vein was also interrogated. Spectral Doppler was utilized to evaluate flow at rest and with distal augmentation maneuvers in the common femoral, femoral and popliteal veins.  COMPARISON:  None.  FINDINGS: Contralateral Common Femoral Vein: Respiratory phasicity is normal and symmetric with the symptomatic side. No evidence of thrombus. Normal compressibility.  Common Femoral Vein: No evidence of thrombus. Normal compressibility, respiratory phasicity and response to augmentation.  Saphenofemoral Junction: No evidence of thrombus. Normal compressibility and flow on color Doppler imaging.  Profunda Femoral Vein: No evidence of thrombus. Normal compressibility and flow on color Doppler imaging.  Femoral Vein: No evidence of thrombus. Normal compressibility, respiratory phasicity and response to augmentation.   Popliteal Vein: No evidence of thrombus. Normal compressibility, respiratory phasicity and response to augmentation.  Calf Veins: No evidence of thrombus. Normal compressibility and flow on color Doppler imaging.  Superficial Great Saphenous Vein: No evidence of thrombus. Normal compressibility and flow on color Doppler imaging.  Venous Reflux:  None.  Other Findings: Question mild edematous subcutaneous tissue at the site of focal pain along the lower calf/ ankle.  IMPRESSION: No evidence of deep venous thrombosis.  Question mild edematous subcutaneous tissue at the site of focal pain along the lower calf/ankle.   Electronically Signed   By: Lovey Newcomer M.D.   On: 05/10/2014 15:01     EKG Interpretation None      MDM   Final diagnoses:  Left leg cellulitis    Filed Vitals:   05/10/14 1402 05/10/14 1527  BP: 155/68 175/77  Pulse: 74 69  Temp: 97.5 F (36.4 C)   TempSrc: Oral   Resp: 16 18  Height: 5' (1.524 m)   Weight: 135 lb (61.236 kg)   SpO2: 98% 99%    Medications  clindamycin (CLEOCIN) capsule 450 mg (450 mg Oral Given 05/10/14 1522)    Erika Fowler is a pleasant 79 y.o. female presenting with cellulitis to left ankle and swelling to left foot. Venous duplex with no DVT. Patient will be started on clindamycin, encouraged her to be as active as possible and push fluids. Also elevate the foot. Return precautions for worsening infections are discussed with her and her daughter-in-law.  Evaluation does not show pathology that would require ongoing emergent intervention or inpatient treatment. Pt is hemodynamically stable and mentating appropriately. Discussed findings and plan with patient/guardian, who agrees with care plan. All questions answered. Return precautions discussed and outpatient follow up given.   Discharge Medication List as of 05/10/2014  3:21 PM    START taking these medications   Details  clindamycin (CLEOCIN) 150 MG capsule Take 3 capsules (450 mg total) by  mouth 4 (four) times daily., Starting 05/10/2014, Until Discontinued, Print             Monico Blitz, PA-C 05/10/14 1545  Elnora Morrison, MD 05/12/14 [phone]

## 2014-05-10 NOTE — Discharge Instructions (Signed)
°  Take your antibiotics as directed and to completion. You should never have any leftover antibiotics! Push fluids and stay well hydrated.    Elevate (Limb above the level of the heart)   Take up to 600mg  of ibuprofen (that is usually 3 over the counter pills)  3 times a day. Take with food.   Cellulitis Cellulitis is an infection of the skin and the tissue beneath it. The infected area is usually red and tender. Cellulitis occurs most often in the arms and lower legs.  CAUSES  Cellulitis is caused by bacteria that enter the skin through cracks or cuts in the skin. The most common types of bacteria that cause cellulitis are staphylococci and streptococci. SIGNS AND SYMPTOMS   Redness and warmth.  Swelling.  Tenderness or pain.  Fever. DIAGNOSIS  Your health care provider can usually determine what is wrong based on a physical exam. Blood tests may also be done. TREATMENT  Treatment usually involves taking an antibiotic medicine. HOME CARE INSTRUCTIONS   Take your antibiotic medicine as directed by your health care provider. Finish the antibiotic even if you start to feel better.  Keep the infected arm or leg elevated to reduce swelling.  Apply a warm cloth to the affected area up to 4 times per day to relieve pain.  Take medicines only as directed by your health care provider.  Keep all follow-up visits as directed by your health care provider. SEEK MEDICAL CARE IF:   You notice red streaks coming from the infected area.  Your red area gets larger or turns dark in color.  Your bone or joint underneath the infected area becomes painful after the skin has healed.  Your infection returns in the same area or another area.  You notice a swollen bump in the infected area.  You develop new symptoms.  You have a fever. SEEK IMMEDIATE MEDICAL CARE IF:   You feel very sleepy.  You develop vomiting or diarrhea.  You have a general ill feeling (malaise) with muscle aches  and pains. MAKE SURE YOU:   Understand these instructions.  Will watch your condition.  Will get help right away if you are not doing well or get worse. Document Released: 11/10/2004 Document Revised: 06/17/2013 Document Reviewed: 04/18/2011 [facility] Patient Information 2015 [place], [place]. This information is not intended to replace advice given to you by your health care provider. Make sure you discuss any questions you have with your health care provider.

## 2014-05-10 NOTE — ED Notes (Signed)
Pt reports has been sedentary with a cold recently and now with pain, swelling, redness and warmth to L leg.  Denies skin integrity issue on same.  Reported soreness, denies cp or sob.

## 2014-06-12 ENCOUNTER — Telehealth: Payer: Self-pay | Admitting: Internal Medicine

## 2014-06-12 MED ORDER — PREDNISONE 10 MG PO TABS: ORAL_TABLET | ORAL | Status: DC

## 2014-06-12 MED ORDER — MESALAMINE ER 0.375 G PO CP24: ORAL_CAPSULE | ORAL | Status: DC

## 2014-06-12 NOTE — Telephone Encounter (Signed)
I will see her tomorrow at 3.45 pm. Please  Start  Prednisone 30 mg today and tomorrow. Also increase Apriso to  3 tabs bid ( 6/day).

## 2014-06-12 NOTE — Telephone Encounter (Signed)
Patient given recommendations and appointment. Rx's sent to pharmacy.

## 2014-06-12 NOTE — Telephone Encounter (Signed)
Spoke with patient and she is having a colitis flare. States she has had lots of bleeding for a couple days. Reports bloody, diarrhea and upper abdominal discomfort. She did have dry heaves x 2 days but none today. No appetite. Denies fever. She will go to clear liquid diet. She is taking Apriso 3 tablets daily. No appointment available this week with extender or MD. Please, advise.

## 2014-06-13 ENCOUNTER — Encounter: Payer: Self-pay | Admitting: Internal Medicine

## 2014-06-13 ENCOUNTER — Ambulatory Visit (INDEPENDENT_AMBULATORY_CARE_PROVIDER_SITE_OTHER): Payer: Medicare Other | Admitting: Internal Medicine

## 2014-06-13 VITALS — BP 148/88 | HR 80 | Ht 59.5 in | Wt 136.2 lb

## 2014-06-13 DIAGNOSIS — K51911 Ulcerative colitis, unspecified with rectal bleeding: Secondary | ICD-10-CM | POA: Diagnosis not present

## 2014-06-13 DIAGNOSIS — R197 Diarrhea, unspecified: Secondary | ICD-10-CM

## 2014-06-13 NOTE — Progress Notes (Signed)
Erika Fowler 10-08-1935 [id number]  Note: This dictation was prepared with Dragon digital system. Any transcriptional errors that result from this procedure are unintentional.   History of Present Illness: This is a  79 year old white female with left-sided ulcerative colitis ,last appointment 04/22/2014. She is having an exacerbation of colitis, which started after two-week course of clindamycin 450 mg 4 times a day for cellulitis of  the left leg. She developed diarrhea, rectal bleeding and urgency. She also developed epigastric pain and nausea. We have started her on prednisone 30 mg daily and increased mesalamine ( Apriso .375 mg) to1.135 grams twice a day. She feels better today. She had  no stools in last 24 hours. Last colonoscopy January 2014 showed mild chronic colitis throughout the entire colon more active from 0-30 cm.    Past Medical History  Diagnosis Date  . Colitis, ulcerative   . Osteopenia   . Uterine prolapse   . Anxiety   . 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 Cellulitis 05/10/14    Past Surgical History  Procedure Laterality Date  . Tonsillectomy      Allergies  Allergen Reactions  . Penicillins     Per pt: unknown    Family history and social history have been reviewed.  Review of Systems:  Diarrhea, rectal bleeding  The remainder of the 10 point ROS is negative except as outlined in the H&P  Physical Exam: General Appearance Well developed, in no distress Eyes  Non icteric  HEENT  Non traumatic, normocephalic  Mouth No lesion, tongue papillated, no cheilosis Neck Supple without adenopathy, thyroid not enlarged, no carotid bruits, no JVD Lungs Clear to auscultation bilaterally COR Normal S1, normal S2, regular rhythm, no murmur, quiet precordium Abdomen soft with tenderness in epigastrium. Normal active bowel sounds. No distention  Rectal  Small amount of Hemoccult positive stool, external hemorrhoids Extremities   trace pedal edema   Left leg Skin No lesions Neurological Alert and oriented x 3 Psychological Normal mood and affect  Assessment and Plan:    79 year old white female with predominantly left-sided ulcerative colitis with mild activity in right and transverse  colon as of last colonoscopy 2 years ago. She now is having a flareup following a course of Clindamycin for cellulitis of the left leg. Her C. Difficile toxin was negative. We will continue on  Prednisone taper going down by 5 mg every 2 weeks. She will also stay on increased dose of mesalamine until I see her in 6 weeks. If the diarrhea continues,we  need to repeat C. Difficile toxin or add Flagyl empirically.    Erika Fowler 06/13/2014

## 2014-06-13 NOTE — Patient Instructions (Addendum)
We are sending you prescriptions to your pharmacy  Prednisone taper:  30 mg tablets x 2 weeks                                 25mg  tablets x 2 weeks                                20mg  tablets x 2 weeks                               15mg  tabltes for 2 weeks                                 20mg  tablets for 2 weeks                                 5 mg tablets for 2 weeks   Increase Ranitidine 150mg  twice a day Increasse Apriso to 3 tablets twice a day   Follow up  08/01/2014 at 11am Dr Derrill Memo

## 2014-08-01 ENCOUNTER — Encounter (INDEPENDENT_AMBULATORY_CARE_PROVIDER_SITE_OTHER): Payer: Self-pay

## 2014-08-01 ENCOUNTER — Ambulatory Visit (INDEPENDENT_AMBULATORY_CARE_PROVIDER_SITE_OTHER): Payer: Medicare Other | Admitting: Internal Medicine

## 2014-08-01 ENCOUNTER — Encounter: Payer: Self-pay | Admitting: Internal Medicine

## 2014-08-01 VITALS — BP 130/80 | HR 70 | Ht 59.5 in | Wt 140.4 lb

## 2014-08-01 DIAGNOSIS — K51911 Ulcerative colitis, unspecified with rectal bleeding: Secondary | ICD-10-CM

## 2014-08-01 NOTE — Patient Instructions (Addendum)
Follow up in 4 months, start calling in late August.  Dr Derrill Memo

## 2014-08-01 NOTE — Progress Notes (Signed)
Erika Fowler [birth year] [id number]  Note: This dictation was prepared with Dragon digital system. Any transcriptional errors that result from this procedure are unintentional.   History of Present Illness: This is a 34 ago white female followed for left-sided ulcerative colitis diagnosed over 40 years ago. I have been following her for past several years. She had exacerbation in March 2016 which responded to budesonide. Last colonoscopy in January 2014 showed mild chronic colitis involving left colon. Biopsies from the right colon were normal. There was more activity from 0-30 cm. There was no dysplasia. The asymptomatic. She was able to taper budesonide about one week ago. She continues mesalamine 4.375 mg 3 tablets daily. She was initially on 6 tablets a day during the flareup and she took it on her on to reduce it to 3 a day. She has soft formed stools. No abdominal pain or bleeding. She takes ranitidine 150 mg when necessary dyspepsia    Past Medical History  Diagnosis Date  . Colitis, ulcerative   . Osteopenia   . Uterine prolapse   . Anxiety   . 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 Cellulitis 05/10/14    Past Surgical History  Procedure Laterality Date  . Tonsillectomy      Allergies  Allergen Reactions  . Penicillins     Per pt: unknown    Family history and social history have been reviewed.  Review of Systems: Denies heartburn. Nausea vomiting  The remainder of the 10 point ROS is negative except as outlined in the H&P  Physical Exam: General Appearance Well developed, in no distress Eyes  Non icteric  HEENT  Non traumatic, normocephalic  Mouth No lesion, tongue papillated, no cheilosis Neck Supple without adenopathy, thyroid not enlarged, no carotid bruits, no JVD Lungs Clear to auscultation bilaterally COR Normal S1, normal S2, regular rhythm, no murmur, quiet precordium Abdomen soft, nontender. Normoactive bowel sounds. No distention Rectal not  repeated Extremities  No pedal edema Skin No lesions Neurological Alert and oriented x 3 Psychological Normal mood and affect  Assessment and Plan:   79 year old white female with  left-sided ulcerative colitis of greater than 40 year duration. She has had rather mild disease and has never had dysplasia. She is currently in remission. Her exacerbations I usually related to stress. She will continue on current regimen and I will see her in 4 months. She will take ranitidine when necessary. She is up to date  on bone density which needs to be followed closely because long exposure to steroid-. Last DEXA scan in 2013 howed lower pole mass consistent with osteopenia    [name] 08/01/2014

## 2014-08-13 ENCOUNTER — Other Ambulatory Visit: Payer: Self-pay | Admitting: Internal Medicine

## 2014-10-01 ENCOUNTER — Ambulatory Visit (INDEPENDENT_AMBULATORY_CARE_PROVIDER_SITE_OTHER): Payer: Medicare Other | Admitting: Internal Medicine

## 2014-10-01 ENCOUNTER — Other Ambulatory Visit (INDEPENDENT_AMBULATORY_CARE_PROVIDER_SITE_OTHER): Payer: Medicare Other

## 2014-10-01 ENCOUNTER — Encounter: Payer: Self-pay | Admitting: Internal Medicine

## 2014-10-01 VITALS — BP 114/80 | HR 80 | Ht 59.5 in | Wt 138.5 lb

## 2014-10-01 DIAGNOSIS — R110 Nausea: Secondary | ICD-10-CM

## 2014-10-01 DIAGNOSIS — R109 Unspecified abdominal pain: Secondary | ICD-10-CM

## 2014-10-01 LAB — CBC WITH DIFFERENTIAL/PLATELET
BASOS ABS: 0.1 10*3/uL (ref 0.0–0.1)
BASOS PCT: 0.7 % (ref 0.0–3.0)
EOS ABS: 0.3 10*3/uL (ref 0.0–0.7)
Eosinophils Relative: 3.4 % (ref 0.0–5.0)
HCT: 44.3 % (ref 36.0–46.0)
Hemoglobin: 14.7 g/dL (ref 12.0–15.0)
LYMPHS ABS: 2.1 10*3/uL (ref 0.7–4.0)
Lymphocytes Relative: 26.6 % (ref 12.0–46.0)
MCHC: 33.2 g/dL (ref 30.0–36.0)
MCV: 90.3 fl (ref 78.0–100.0)
MONO ABS: 0.6 10*3/uL (ref 0.1–1.0)
Monocytes Relative: 7.4 % (ref 3.0–12.0)
NEUTROS ABS: 4.9 10*3/uL (ref 1.4–7.7)
NEUTROS PCT: 61.9 % (ref 43.0–77.0)
PLATELETS: 333 10*3/uL (ref 150.0–400.0)
RBC: 4.9 Mil/uL (ref 3.87–5.11)
RDW: 14.2 % (ref 11.5–15.5)
WBC: 7.9 10*3/uL (ref 4.0–10.5)

## 2014-10-01 LAB — HEPATIC FUNCTION PANEL
ALK PHOS: 58 U/L (ref 39–117)
ALT: 17 U/L (ref 0–35)
AST: 21 U/L (ref 0–37)
Albumin: 4 g/dL (ref 3.5–5.2)
BILIRUBIN DIRECT: 0.2 mg/dL (ref 0.0–0.3)
BILIRUBIN TOTAL: 0.9 mg/dL (ref 0.2–1.2)
TOTAL PROTEIN: 6.8 g/dL (ref 6.0–8.3)

## 2014-10-01 LAB — LIPASE: Lipase: 17 U/L (ref 11.0–59.0)

## 2014-10-01 LAB — SEDIMENTATION RATE: Sed Rate: 19 mm/hr (ref 0–22)

## 2014-10-01 LAB — AMYLASE: AMYLASE: 45 U/L (ref 27–131)

## 2014-10-01 MED ORDER — SUCRALFATE 1 GM/10ML PO SUSP: 1.0000 g | Freq: Two times a day (BID) | ORAL | Status: DC

## 2014-10-01 MED ORDER — ONDANSETRON HCL 4 MG PO TABS: 4.0000 mg | ORAL_TABLET | ORAL | Status: DC

## 2014-10-01 NOTE — Progress Notes (Signed)
Erika Fowler [birth year] [id number]  Note: This dictation was prepared with Dragon digital system. Any transcriptional errors that result from this procedure are unintentional.   History of Present Illness: This is a 79 year old white female with ulcerative colitis of greater than 40 years duration. Recent flare up in March 2016 responded to budesonide. Last colonoscopy in January 2014 showed mild chronic active colitis involving left colon. Right colon biopsies were negative for inflammation . The most severe changes were noted between  0-30 cm. Patient has been on Apriso 0.375 mg 3 Twice a day to total of 2.25 g. She is also on ranitidine 150 mg when necessary reflux. She has osteopenia due to long exposure to steroids. She is here today because of acute upper GI upset which started about 2 weeks ago with nausea vomiting and epigastric discomfort. She has been on ranitidine 150 mg twice a day. Denies fever jaundice. Her colitis has been under good control.    Past Medical History  Diagnosis Date  . Colitis, ulcerative   . Osteopenia   . Uterine prolapse   . Anxiety   . 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 Cellulitis 05/10/14    Past Surgical History  Procedure Laterality Date  . Tonsillectomy      Allergies  Allergen Reactions  . Penicillins     Per pt: unknown    Family history and social history have been reviewed.  Review of Systems: Offered, dyspepsia, nausea, vomiting  The remainder of the 10 point ROS is negative except as outlined in the H&P  Physical Exam: General Appearance Well developed, in no distress Eyes  Non icteric  HEENT  Non traumatic, normocephalic  Mouth No lesion, tongue papillated, no cheilosis Neck Supple without adenopathy, thyroid not enlarged, no carotid bruits, no JVD Lungs Clear to auscultation bilaterally COR Normal S1, normal S2, regular rhythm, no murmur, quiet precordium Abdomen very tender in epigastrium. Normoactive bowel  sounds. Liver edge at costal margin. Hyperactive bowel sounds Rectal not done Extremities  No pedal edema Skin No lesions Neurological Alert and oriented x 3 Psychological Normal mood and affect  Assessment and Plan:   79 year old white female with history of f ulcerative colitis who has an acute problem of nausea vomiting and epigastric discomfort. Physical exam suggestive of gastritis. She will continue ranitidine 150 mg twice a day, Zofran 4 mg when necessary nausea and add Carafate 10 cc by mouth twice a day. We will also go ahead with upper abdominal ultrasound to rule out biliary dysfunction. We will check labs including amylase, lipase, liver function tests, CBC.  Follow up with Dr Donneta Romberg 10/01/2014

## 2014-10-01 NOTE — Patient Instructions (Signed)
You have been scheduled for an abdominal ultrasound at [facility] (1st floor of hospital) on 10/06/2014 at 11:30am. Please arrive 15 minutes prior to your appointment for registration. Make certain not to have anything to eat or drink 6 hours prior to your appointment. Should you need to reschedule your appointment, please contact radiology at [phone]. This test typically takes about 30 minutes to perform.  Go to the basements for labs today Medications sent to your pharmacy  Zofran and Carafate

## 2014-10-06 ENCOUNTER — Ambulatory Visit (HOSPITAL_COMMUNITY)
Admission: RE | Admit: 2014-10-06 | Discharge: 2014-10-06 | Disposition: A | Discharge status: Home / Self Care | Payer: Medicare Other | Source: Ambulatory Visit | Attending: Internal Medicine | Admitting: Internal Medicine

## 2014-10-06 ENCOUNTER — Telehealth: Payer: Self-pay | Admitting: Internal Medicine

## 2014-10-06 DIAGNOSIS — R109 Unspecified abdominal pain: Secondary | ICD-10-CM

## 2014-10-06 DIAGNOSIS — R197 Diarrhea, unspecified: Secondary | ICD-10-CM

## 2014-10-06 DIAGNOSIS — R110 Nausea: Secondary | ICD-10-CM

## 2014-10-06 DIAGNOSIS — R112 Nausea with vomiting, unspecified: Secondary | ICD-10-CM | POA: Diagnosis not present

## 2014-10-06 NOTE — Telephone Encounter (Signed)
Patient states she is having diarrhea after she eats. Denies bleeding or pain. She states she stopped her Apriso because of the nausea. She states the Zofran helps her nausea. Reports she is sore from having diarrhea so much. Has not taken anything for diarrhea. Please, advise.

## 2014-10-06 NOTE — Telephone Encounter (Signed)
If stopping Apriso did not help then , please, obtain stool fpr Pathogens and Lactoferrin and  Start Flagyl 250 mg po tid x 1 week.Call back with an update.

## 2014-10-07 MED ORDER — METRONIDAZOLE 250 MG PO TABS: ORAL_TABLET | ORAL | Status: DC

## 2014-10-07 NOTE — Telephone Encounter (Signed)
Patient notified. Labs in EPIC. RX sent.

## 2014-10-09 ENCOUNTER — Other Ambulatory Visit: Payer: Medicare Other

## 2014-10-09 ENCOUNTER — Telehealth: Payer: Self-pay | Admitting: Internal Medicine

## 2014-10-09 ENCOUNTER — Other Ambulatory Visit: Payer: Self-pay | Admitting: Internal Medicine

## 2014-10-09 DIAGNOSIS — R197 Diarrhea, unspecified: Secondary | ICD-10-CM

## 2014-10-09 NOTE — Telephone Encounter (Signed)
Spoke with patient and she will try eating before taking Flagyl. She will bring her stool sample in today.

## 2014-10-10 ENCOUNTER — Other Ambulatory Visit: Payer: Self-pay | Admitting: *Deleted

## 2014-10-10 ENCOUNTER — Encounter: Payer: Self-pay | Admitting: Internal Medicine

## 2014-10-10 LAB — FECAL LACTOFERRIN, QUANT: Lactoferrin: POSITIVE

## 2014-10-10 MED ORDER — BUDESONIDE 3 MG PO CPEP: ORAL_CAPSULE | ORAL | Status: DC

## 2014-10-10 NOTE — Progress Notes (Signed)
Quick Note:  Fill Entocort as written but have 1 RF and continue until seen ______

## 2014-10-10 NOTE — Telephone Encounter (Signed)
This encounter was created in error - please disregard.

## 2014-10-13 LAB — GASTROINTESTINAL PATHOGEN PANEL PCR
C. difficile Tox A/B, PCR: NEGATIVE
CAMPYLOBACTER, PCR: NEGATIVE
Cryptosporidium, PCR: NEGATIVE
E COLI (STEC) STX1/STX2, PCR: NEGATIVE
E coli (ETEC) LT/ST PCR: NEGATIVE
E coli 0157, PCR: NEGATIVE
Giardia lamblia, PCR: NEGATIVE
NOROVIRUS, PCR: NEGATIVE
ROTAVIRUS, PCR: NEGATIVE
SALMONELLA, PCR: NEGATIVE
SHIGELLA, PCR: NEGATIVE

## 2014-10-21 ENCOUNTER — Encounter: Payer: Self-pay | Admitting: *Deleted

## 2014-10-21 ENCOUNTER — Telehealth: Payer: Self-pay | Admitting: Internal Medicine

## 2014-10-21 NOTE — Telephone Encounter (Signed)
Patient's carafate rx has been approved through [employer]. Josem Kaufmann is good from 02/12/14-02/14/15. Referral # is [id number].

## 2014-10-21 NOTE — Telephone Encounter (Signed)
Dottie taking care of, prior auth.

## 2014-10-27 ENCOUNTER — Other Ambulatory Visit (INDEPENDENT_AMBULATORY_CARE_PROVIDER_SITE_OTHER): Payer: Medicare Other

## 2014-10-27 ENCOUNTER — Encounter: Payer: Self-pay | Admitting: Physician Assistant

## 2014-10-27 ENCOUNTER — Ambulatory Visit (INDEPENDENT_AMBULATORY_CARE_PROVIDER_SITE_OTHER): Payer: Medicare Other | Admitting: Physician Assistant

## 2014-10-27 VITALS — BP 112/76 | HR 72 | Ht 59.5 in | Wt 133.6 lb

## 2014-10-27 DIAGNOSIS — R110 Nausea: Secondary | ICD-10-CM

## 2014-10-27 DIAGNOSIS — R6881 Early satiety: Secondary | ICD-10-CM | POA: Diagnosis not present

## 2014-10-27 DIAGNOSIS — K5120 Ulcerative (chronic) proctitis without complications: Secondary | ICD-10-CM

## 2014-10-27 DIAGNOSIS — R1013 Epigastric pain: Secondary | ICD-10-CM | POA: Diagnosis not present

## 2014-10-27 LAB — BASIC METABOLIC PANEL
BUN: 23 mg/dL (ref 6–23)
CALCIUM: 9.3 mg/dL (ref 8.4–10.5)
CO2: 29 mEq/L (ref 19–32)
Chloride: 104 mEq/L (ref 96–112)
Creatinine, Ser: 0.61 mg/dL (ref 0.40–1.20)
GFR: 101.13 mL/min (ref >60.00)
GLUCOSE: 102 mg/dL — AB (ref 70–99)
Potassium: 3.9 mEq/L (ref 3.5–5.1)
SODIUM: 140 meq/L (ref 135–145)

## 2014-10-27 MED ORDER — PANTOPRAZOLE SODIUM 40 MG PO TBEC: 40.0000 mg | DELAYED_RELEASE_TABLET | Freq: Every day | ORAL | Status: DC

## 2014-10-27 MED ORDER — ONDANSETRON HCL 4 MG PO TABS: ORAL_TABLET | ORAL | Status: DC

## 2014-10-27 NOTE — Patient Instructions (Addendum)
Please go to the basement level to have your labs drawn.  Stop Zantac. Stop Budesonide. We sent a prescripiton to [address]. Pantoprazole sodium 40 mg. We sent refills for the Zofran 4 mg.    You have been scheduled for a CT scan of the abdomen and pelvis at [facility] ([address]---this is in the same building as Press [facility]).   You are scheduled on Thursday 10-30-2014 at  3:00 am  . You should arrive 2:45 PM  to your appointment time for registration. Please follow the written instructions below on the day of your exam:  WARNING: IF YOU ARE ALLERGIC TO IODINE/X-RAY DYE, PLEASE NOTIFY RADIOLOGY IMMEDIATELY AT [phone]! YOU WILL BE GIVEN A 13 HOUR PREMEDICATION PREP.  1) Do not eat or drink anything after 1:00 PM  (2 hours prior to your test) 2) You have been given 2 bottles of oral contrast to drink. The solution may taste better if refrigerated, but do NOT add ice or any other liquid to this solution. Shake well before drinking.     Drink 1 bottle of contrast @   10:30 am (1 hour prior to your exam)  You may take any medications as prescribed with a small amount of water except for the following: Metformin, Glucophage, Glucovance, Avandamet, Riomet, Fortamet, Actoplus Met, Janumet, Glumetza or Metaglip. The above medications must be held the day of the exam AND 48 hours after the exam.  The purpose of you drinking the oral contrast is to aid in the visualization of your intestinal tract. The contrast solution may cause some diarrhea. Before your exam is started, you will be given a small amount of fluid to drink. Depending on your individual set of symptoms, you may also receive an intravenous injection of x-ray contrast/dye. Plan on being at [facility] for 30 minutes or long, depending on the type of exam you are having performed.  If you have any questions regarding your exam or if you need to reschedule, you may call the CT  department at [phone] between the hours of 8:00 am and 5:00 pm, Monday-Friday.  ________________________________________________________________________

## 2014-10-27 NOTE — Progress Notes (Signed)
Patient ID: Erika Fowler, female   DOB: [birth year], 79 y.o.   MRN: [id number]   Subjective:    Patient ID: Erika Fowler, female    DOB: [birth year], 79 y.o.   MRN: [id number]  HPI [name] is a very nice 79 year old white female former patient of Dr. [name] with long history of ulcerative colitis, left-sided diagnosed approximately 40 years ago. Her last colonoscopy was done in January 2014 and she had mild chronic active colitis of the left colon. Biopsies were taken from the right colon and were negative. She had a mild flare in March 2016 and responded to a course of budesonide. She stays on a pre-so for maintenance, 3 tablets twice daily. She was last seen here in mid August complaining of 1-2 week history of upper abdominal discomfort and nausea. She underwent upper abdominal ultrasound which was negative with the exception of a 7 mm left renal calculus. Labs were done and unremarkable and she had a stool for lactoferrin done which was positive. She had initially been started on Zantac 150 twice daily and Zofran as needed for nausea. When the lactoferrin returned positive Dr. Olevia Perches started a course of Entocort 9 mg by mouth daily. Patient comes back in today for follow-up stating that she's feels about the same. She has had a ongoing constant queasiness which seems to be somewhat worse at night she says she's unable to lie on her right or left side because seems to increase her nausea and discomfort and feels better on her back. Somewhat lasts however her weight is stable. She says she feels worse with larger amounts of food and feels that she has a sensation of filling up quickly. She also has some vague dysphagia but primarily complains of the sensation of feeling full "" all the time. He had been having some difficulty with constipation which is now better with MiraLAX. Patient feels that the Entocort may be increasing her nausea.  Review of Systems Pertinent positive and negative review of  systems were noted in the above HPI section.  All other review of systems was otherwise negative.  Outpatient Encounter Prescriptions as of 10/27/2014  Medication Sig  . budesonide (ENTOCORT EC) 3 MG 24 hr capsule Take 3 tablets po daily x 4 weeks.  . Cholecalciferol (VITAMIN D) 2000 UNITS tablet Take 2,000 Units by mouth daily.    Marland Kitchen escitalopram (LEXAPRO) 10 MG tablet TAKE 1 TABLET DAILY  . mesalamine (APRISO) 0.375 G 24 hr capsule Take 3 tablets BID. (6 tablets/day)  . Multiple Vitamins-Minerals (PRESERVISION/LUTEIN PO) Take by mouth.    . ondansetron (ZOFRAN) 4 MG tablet Take 1 tab every 6-8 hours as needed for nausea.  . polyethylene glycol (MIRALAX / GLYCOLAX) packet Take 17 g by mouth daily.  . pramoxine (PROCTOFOAM) 1 % foam Place 1 application rectally at bedtime.  . raloxifene (EVISTA) 60 MG tablet Take 60 mg by mouth daily.  . ranitidine (ZANTAC) 150 MG tablet Take 1 tablet (150 mg total) by mouth 2 (two) times daily.  . [DISCONTINUED] ondansetron (ZOFRAN) 4 MG tablet Take 1 tablet (4 mg total) by mouth every 4 (four) hours.  . pantoprazole (PROTONIX) 40 MG tablet Take 1 tablet (40 mg total) by mouth daily.  . [DISCONTINUED] metroNIDAZOLE (FLAGYL) 250 MG tablet Take one po TID x 7 days (Patient not taking: Reported on 10/27/2014)  . [DISCONTINUED] sucralfate (CARAFATE) 1 GM/10ML suspension Take 10 mLs (1 g total) by mouth 2 (two) times daily. (Patient not taking: Reported  on 10/27/2014)   No facility-administered encounter medications on file as of 10/27/2014.   Allergies  Allergen Reactions  . Penicillins     Per pt: unknown   Patient Active Problem List   Diagnosis Date Noted  . Osteopenia   . Uterine prolapse   . ANXIETY 08/09/2007  . Ulcerative colitis 08/09/2007   Social History   Social History  . Marital Status: Married    Spouse Name: N/A  . Number of Children: 2  . Years of Education: N/A   Occupational History  . Retired    Social History Main Topics  .  Smoking status: Never Smoker   . Smokeless tobacco: Never Used  . Alcohol Use: 4.2 oz/week    7 Standard drinks or equivalent per week     Comment: wine   . Drug Use: No  . Sexual Activity: Yes    Birth Control/ Protection: Post-menopausal   Other Topics Concern  . Not on file   Social History Narrative    Ms. Defrank's family history includes Breast cancer in her maternal aunt, maternal aunt, mother, paternal aunt, and sister; Colon cancer (age of onset: 44) in her mother; Heart disease in her mother; Hypertension in her sister; Inflammatory bowel disease in her sister; Pancreatic cancer in her father. There is no history of Stomach cancer.      Objective:    Filed Vitals:   10/27/14 1326  BP: 112/76  Pulse: 72    Physical Exam  well-developed elderly white female in no acute distress, quite pleasant blood pressure 112/76 pulse 72 height 4 foot 11 weight 133. HEENT; nontraumatic normocephalic EOMI PERRLA sclera anicteric, Neck ;supple no JVD, Cardiovascular ;regular rate and rhythm with S1-S2 no murmur or gallop, Pulmonary; clear bilaterally, Abdomen ;soft bowel sounds are present she is tender in the epigastrium and across upper abdomen there is no guarding or rebound no palpable mass or hepatosplenomegaly bowel sounds are present, Rectal; exam not done, EXt; no clubbing cyanosis or edema skin in warm and dry, Neuropsych ;mood and affect appropriate       Assessment & Plan:   #1 79 yo female with 6 week hx of persistent nausea/queasiness,early satiety and epigastric discomfort. Etiology not clear- r/o PUD, gastropareis underlying malignancy. #2 long hx of left sided ulcerative colitis- no active sxs- last colon 02/2012 #3 osteopenia  Plan; Stop budesonide  Continue Apriso-2 po tid  Stop Zantac and give trail of Protonix 40 mg po qam  Refill Zofran 4 mg po q 6 hours prn Schedule for Ct abd with contrast   Schedule for EGD with Dr .Silverio Decamp -procedure discussed in detail with  pt and she is agreeable to proceed.  Landy Dunnavant Genia Harold PA-C 10/27/2014   Cc: Mayra Neer, MD

## 2014-10-28 NOTE — Progress Notes (Signed)
i agree with the above note, plan 

## 2014-10-30 ENCOUNTER — Ambulatory Visit (INDEPENDENT_AMBULATORY_CARE_PROVIDER_SITE_OTHER)
Admission: RE | Admit: 2014-10-30 | Discharge: 2014-10-30 | Disposition: A | Discharge status: Home / Self Care | Payer: Medicare Other | Source: Ambulatory Visit | Attending: Physician Assistant | Admitting: Physician Assistant

## 2014-10-30 DIAGNOSIS — R1013 Epigastric pain: Secondary | ICD-10-CM

## 2014-10-30 DIAGNOSIS — K5120 Ulcerative (chronic) proctitis without complications: Secondary | ICD-10-CM | POA: Diagnosis not present

## 2014-10-30 DIAGNOSIS — R6881 Early satiety: Secondary | ICD-10-CM | POA: Diagnosis not present

## 2014-10-30 DIAGNOSIS — R110 Nausea: Secondary | ICD-10-CM | POA: Diagnosis not present

## 2014-10-30 MED ORDER — IOHEXOL 300 MG/ML  SOLN
80.0000 mL | Freq: Once | INTRAMUSCULAR | Status: AC | PRN
  Administered 2014-10-30: 80 mL via INTRAVENOUS

## 2014-11-05 ENCOUNTER — Other Ambulatory Visit: Payer: Self-pay

## 2014-11-05 DIAGNOSIS — Z1231 Encounter for screening mammogram for malignant neoplasm of breast: Secondary | ICD-10-CM

## 2014-12-08 ENCOUNTER — Ambulatory Visit
Admission: RE | Admit: 2014-12-08 | Discharge: 2014-12-08 | Disposition: A | Discharge status: Home / Self Care | Payer: Medicare Other | Source: Ambulatory Visit

## 2014-12-08 ENCOUNTER — Telehealth: Payer: Self-pay | Admitting: *Deleted

## 2014-12-08 DIAGNOSIS — Z1231 Encounter for screening mammogram for malignant neoplasm of breast: Secondary | ICD-10-CM

## 2014-12-08 NOTE — Telephone Encounter (Signed)
Can patient have refills on her Apriso, She has an EGD scheduled with you on 10/31

## 2014-12-09 MED ORDER — MESALAMINE ER 0.375 G PO CP24: ORAL_CAPSULE | ORAL | Status: AC

## 2014-12-09 NOTE — Telephone Encounter (Signed)
L/m for patient that med was refilled

## 2014-12-09 NOTE — Telephone Encounter (Signed)
Ok to refill. Thanks 

## 2014-12-15 ENCOUNTER — Ambulatory Visit (AMBULATORY_SURGERY_CENTER): Payer: Medicare Other | Admitting: Gastroenterology

## 2014-12-15 ENCOUNTER — Encounter: Payer: Self-pay | Admitting: Gastroenterology

## 2014-12-15 VITALS — BP 148/97 | HR 70 | Temp 97.5°F | Resp 17 | Ht 59.0 in | Wt 133.0 lb

## 2014-12-15 DIAGNOSIS — R1013 Epigastric pain: Secondary | ICD-10-CM | POA: Diagnosis not present

## 2014-12-15 DIAGNOSIS — G8929 Other chronic pain: Secondary | ICD-10-CM

## 2014-12-15 DIAGNOSIS — R110 Nausea: Secondary | ICD-10-CM | POA: Diagnosis not present

## 2014-12-15 DIAGNOSIS — K259 Gastric ulcer, unspecified as acute or chronic, without hemorrhage or perforation: Secondary | ICD-10-CM

## 2014-12-15 MED ORDER — SODIUM CHLORIDE 0.9 % IV SOLN: 500.0000 mL | INTRAVENOUS | Status: DC

## 2014-12-15 NOTE — Patient Instructions (Signed)
Biopsies taken today.  A result letter will come in your mail in about 1-2 weeks. AVOID NSAIDS!!  YOU HAD AN ENDOSCOPIC PROCEDURE TODAY AT [facility]:   Refer to the procedure report that was given to you for any specific questions about what was found during the examination.  If the procedure report does not answer your questions, please call your gastroenterologist to clarify.  If you requested that your care partner not be given the details of your procedure findings, then the procedure report has been included in a sealed envelope for you to review at your convenience later.  YOU SHOULD EXPECT: Some feelings of bloating in the abdomen. Passage of more gas than usual.  Walking can help get rid of the air that was put into your GI tract during the procedure and reduce the bloating. If you had a lower endoscopy (such as a colonoscopy or flexible sigmoidoscopy) you may notice spotting of blood in your stool or on the toilet paper. If you underwent a bowel prep for your procedure, you may not have a normal bowel movement for a few days.  Please Note:  You might notice some irritation and congestion in your nose or some drainage.  This is from the oxygen used during your procedure.  There is no need for concern and it should clear up in a day or so.  SYMPTOMS TO REPORT IMMEDIATELY:    Following upper endoscopy (EGD)  Vomiting of blood or coffee ground material  New chest pain or pain under the shoulder blades  Painful or persistently difficult swallowing  New shortness of breath  Fever of 100F or higher  Black, tarry-looking stools  For urgent or emergent issues, a gastroenterologist can be reached at any hour by calling [phone].   DIET: Your first meal following the procedure should be a small meal and then it is ok to progress to your normal diet. Heavy or fried foods are harder to digest and may make you feel nauseous or bloated.  Likewise, meals heavy in dairy and  vegetables can increase bloating.  Drink plenty of fluids but you should avoid alcoholic beverages for 24 hours.  ACTIVITY:  You should plan to take it easy for the rest of today and you should NOT DRIVE or use heavy machinery until tomorrow (because of the sedation medicines used during the test).    FOLLOW UP: Our staff will call the number listed on your records the next business day following your procedure to check on you and address any questions or concerns that you may have regarding the information given to you following your procedure. If we do not reach you, we will leave a message.  However, if you are feeling well and you are not experiencing any problems, there is no need to return our call.  We will assume that you have returned to your regular daily activities without incident.  If any biopsies were taken you will be contacted by phone or by letter within the next 1-3 weeks.  Please call us at [phone] if you have not heard about the biopsies in 3 weeks.    SIGNATURES/CONFIDENTIALITY: You and/or your care partner have signed paperwork which will be entered into your electronic medical record.  These signatures attest to the fact that that the information above on your After Visit Summary has been reviewed and is understood.  Full responsibility of the confidentiality of this discharge information lies with you and/or your care-partner.

## 2014-12-15 NOTE — Op Note (Signed)
[name] [place], [address]   ENDOSCOPY PROCEDURE REPORT  PATIENT: Erika, Fowler  MR#: [id number] BIRTHDATE: Aug 11, 1935 , 78  yrs. old GENDER: female ENDOSCOPIST: Harl Bowie, MD REFERRED BY:  Mayra Neer, M.D. PROCEDURE DATE:  12/15/2014 PROCEDURE:  EGD w/ biopsy for H.pylori and EGD w/ biopsy ASA CLASS:     Class II INDICATIONS:  epigastric pain, dyspepsia, and nausea. MEDICATIONS: Propofol 250 mg IV TOPICAL ANESTHETIC: none  DESCRIPTION OF PROCEDURE: After the risks benefits and alternatives of the procedure were thoroughly explained, informed consent was obtained.  The LB [id number] endoscope was introduced through the mouth and advanced to the second portion of the duodenum , Without limitations.  The instrument was slowly withdrawn as the mucosa was fully examined.  Esophageal mucosa appeared normal.  irregular Z line with short segment of salmon pink mucosa concerning for Barrett's esophagus, biopsies were obtained. Multiple erosions in the gastric antrum with petechiae, multiple random biopsies were obtained from the antrum and body of stomach to rule out H.  pylori. Duodenal bulb and second part of duodenum appeared normal. Retroflexed views revealed no abnormalities.     The scope was then withdrawn from the patient and the procedure completed.  COMPLICATIONS: There were no immediate complications.  ENDOSCOPIC IMPRESSION: Irregular Z line at GE junction Multiple erosions in the gastric antrum  RECOMMENDATIONS: 1.  Avoid NSAIDS 2.  Await biopsy results, follow up H.pylori results    eSigned:  Harl Bowie, MD 12/15/2014 11:46 AM

## 2014-12-15 NOTE — Progress Notes (Signed)
Called to room to assist during endoscopic procedure.  Patient ID and intended procedure confirmed with present staff. Received instructions for my participation in the procedure from the performing physician.  

## 2014-12-15 NOTE — Progress Notes (Signed)
To recovery, replort to Doyle Askew, Therapist, sports, VSS

## 2014-12-16 ENCOUNTER — Telehealth: Payer: Self-pay | Admitting: *Deleted

## 2014-12-16 NOTE — Telephone Encounter (Signed)
  Follow up Call-  Call back number 12/15/2014  Post procedure Call Back phone  # [phone]  Permission to leave phone message Yes     Patient questions:  Do you have a fever, pain , or abdominal swelling? No. Pain Score  0 *  Have you tolerated food without any problems? Yes.    Have you been able to return to your normal activities? Yes.    Do you have any questions about your discharge instructions: Diet   No. Medications  No. Follow up visit  No.  Do you have questions or concerns about your Care? No.  Actions: * If pain score is 4 or above: No action needed, pain <4.

## 2014-12-19 ENCOUNTER — Other Ambulatory Visit: Payer: Self-pay

## 2014-12-19 MED ORDER — BIS SUBCIT-METRONID-TETRACYC 140-125-125 MG PO CAPS: 3.0000 | ORAL_CAPSULE | Freq: Three times a day (TID) | ORAL | Status: DC

## 2014-12-19 MED ORDER — PANTOPRAZOLE SODIUM 40 MG PO TBEC: DELAYED_RELEASE_TABLET | ORAL | Status: DC

## 2014-12-22 ENCOUNTER — Other Ambulatory Visit: Payer: Self-pay

## 2014-12-22 DIAGNOSIS — K219 Gastro-esophageal reflux disease without esophagitis: Secondary | ICD-10-CM

## 2014-12-24 ENCOUNTER — Telehealth: Payer: Self-pay | Admitting: Gastroenterology

## 2014-12-24 NOTE — Telephone Encounter (Signed)
Looking to the prior authorization vs changing treatment

## 2014-12-25 NOTE — Telephone Encounter (Signed)
Auth done  Waiting on response from insurance

## 2014-12-25 NOTE — Telephone Encounter (Signed)
Advised patient Erika Fowler is doing the prior authorization trying to get insurance to cover the cost.

## 2014-12-26 NOTE — Telephone Encounter (Signed)
Approved for [place]  Approval sent to be scanned in

## 2015-01-20 ENCOUNTER — Other Ambulatory Visit: Payer: Medicare Other

## 2015-01-20 DIAGNOSIS — K219 Gastro-esophageal reflux disease without esophagitis: Secondary | ICD-10-CM

## 2015-01-21 ENCOUNTER — Other Ambulatory Visit (INDEPENDENT_AMBULATORY_CARE_PROVIDER_SITE_OTHER): Payer: Medicare Other

## 2015-01-21 ENCOUNTER — Telehealth: Payer: Self-pay | Admitting: Gastroenterology

## 2015-01-21 ENCOUNTER — Other Ambulatory Visit: Payer: Self-pay

## 2015-01-21 DIAGNOSIS — R197 Diarrhea, unspecified: Secondary | ICD-10-CM

## 2015-01-21 LAB — CBC WITH DIFFERENTIAL/PLATELET
BASOS ABS: 0 10*3/uL (ref 0.0–0.1)
Basophils Relative: 0.7 % (ref 0.0–3.0)
EOS ABS: 0.2 10*3/uL (ref 0.0–0.7)
Eosinophils Relative: 2.5 % (ref 0.0–5.0)
HCT: 43.3 % (ref 36.0–46.0)
Hemoglobin: 14.2 g/dL (ref 12.0–15.0)
LYMPHS ABS: 1.8 10*3/uL (ref 0.7–4.0)
Lymphocytes Relative: 28.4 % (ref 12.0–46.0)
MCHC: 32.9 g/dL (ref 30.0–36.0)
MCV: 90.4 fl (ref 78.0–100.0)
MONO ABS: 0.5 10*3/uL (ref 0.1–1.0)
MONOS PCT: 8.4 % (ref 3.0–12.0)
NEUTROS PCT: 60 % (ref 43.0–77.0)
Neutro Abs: 3.8 10*3/uL (ref 1.4–7.7)
Platelets: 330 10*3/uL (ref 150.0–400.0)
RBC: 4.78 Mil/uL (ref 3.87–5.11)
RDW: 14.9 % (ref 11.5–15.5)
WBC: 6.3 10*3/uL (ref 4.0–10.5)

## 2015-01-21 LAB — BASIC METABOLIC PANEL
BUN: 18 mg/dL (ref 6–23)
CHLORIDE: 104 meq/L (ref 96–112)
CO2: 30 meq/L (ref 19–32)
Calcium: 9.3 mg/dL (ref 8.4–10.5)
Creatinine, Ser: 0.83 mg/dL (ref 0.40–1.20)
GFR: 70.84 mL/min (ref >60.00)
Glucose, Bld: 97 mg/dL (ref 70–99)
POTASSIUM: 4.7 meq/L (ref 3.5–5.1)
SODIUM: 142 meq/L (ref 135–145)

## 2015-01-21 NOTE — Telephone Encounter (Signed)
Patient will keep her appointment tomorrow. She will attempt to come in today.

## 2015-01-21 NOTE — Telephone Encounter (Signed)
Diarrhea with mucus and blood. Check C.diff, CBC and BMP. Agree with urgent follow up visit tomorrow. Thanks

## 2015-01-21 NOTE — Telephone Encounter (Signed)
No answer at the phone number left by the patient.

## 2015-01-21 NOTE — Telephone Encounter (Signed)
Patient has had continuous bloody leakage and blood with urgent bowel movements. This has been going on for 2 days. She now has lower abdominal cramping and urgent liquid bowel movements. I have her scheduled her for the first available appointment tomorrow. Is there anything she should do in the meantime?

## 2015-01-22 ENCOUNTER — Ambulatory Visit (INDEPENDENT_AMBULATORY_CARE_PROVIDER_SITE_OTHER): Payer: Medicare Other | Admitting: Gastroenterology

## 2015-01-22 ENCOUNTER — Encounter: Payer: Self-pay | Admitting: Gastroenterology

## 2015-01-22 VITALS — BP 130/86 | HR 84 | Ht 59.0 in | Wt 132.0 lb

## 2015-01-22 DIAGNOSIS — B9681 Helicobacter pylori [H. pylori] as the cause of diseases classified elsewhere: Secondary | ICD-10-CM

## 2015-01-22 DIAGNOSIS — K625 Hemorrhage of anus and rectum: Secondary | ICD-10-CM

## 2015-01-22 DIAGNOSIS — K51919 Ulcerative colitis, unspecified with unspecified complications: Secondary | ICD-10-CM | POA: Diagnosis not present

## 2015-01-22 DIAGNOSIS — A048 Other specified bacterial intestinal infections: Secondary | ICD-10-CM

## 2015-01-22 LAB — H. PYLORI ANTIGEN, STOOL: H PYLORI AG STL: NEGATIVE

## 2015-01-22 MED ORDER — BUDESONIDE 3 MG PO CPEP: 3.0000 mg | ORAL_CAPSULE | Freq: Three times a day (TID) | ORAL | Status: DC

## 2015-01-22 MED ORDER — NA SULFATE-K SULFATE-MG SULF 17.5-3.13-1.6 GM/177ML PO SOLN: 1.0000 | ORAL | Status: DC | PRN

## 2015-01-22 NOTE — Patient Instructions (Addendum)
Your physician has requested that you go to the basement for the following lab work before leaving today: For Stool C Diff today Have another stool test in 4 weeks. We will call and remind you.   We have sent the following medications to your pharmacy for you to pick up at your convenience: Budesonide 3 mg three times a day  Call back in 2 weeks to give [place] an update on how you're feeling. Ask for [employer]   You have been scheduled for a colonoscopy. Please follow written instructions given to you at your visit today.  Please pick up your prep supplies at the pharmacy within the next 1-3 days. If you use inhalers (even only as needed), please bring them with you on the day of your procedure. Your physician has requested that you go to www.startemmi.com and enter the access code given to you at your visit today. This web site gives a general overview about your procedure. However, you should still follow specific instructions given to you by our office regarding your preparation for the procedure.

## 2015-01-23 ENCOUNTER — Encounter: Payer: Self-pay | Admitting: Emergency Medicine

## 2015-01-23 NOTE — Telephone Encounter (Signed)
Note opened in error.

## 2015-01-30 ENCOUNTER — Telehealth: Payer: Self-pay | Admitting: Gastroenterology

## 2015-01-30 DIAGNOSIS — K51219 Ulcerative (chronic) proctitis with unspecified complications: Secondary | ICD-10-CM

## 2015-01-30 NOTE — Telephone Encounter (Signed)
Patient saw Erika Bogus, PA on 01/22/15 and was given Entocort. She states every time she takes it she is sick. Vomited yesterday. Unable to eat. Also, states she is still having diarrhea when she does eat. Please, advise.

## 2015-01-30 NOTE — Telephone Encounter (Signed)
Patient given recommendations. Labs in EPIC.

## 2015-01-30 NOTE — Telephone Encounter (Signed)
Erika Fowler, I do not see the note from 12/8, please check with [place].Advise patient to do CBC, BMP and C.diff. If she is unable to tolerate PO, she may need to come to ER. She can stop taking Entocort.

## 2015-02-02 ENCOUNTER — Telehealth: Payer: Self-pay | Admitting: Gastroenterology

## 2015-02-02 ENCOUNTER — Other Ambulatory Visit: Payer: Self-pay

## 2015-02-02 ENCOUNTER — Other Ambulatory Visit (INDEPENDENT_AMBULATORY_CARE_PROVIDER_SITE_OTHER): Payer: Medicare Other

## 2015-02-02 DIAGNOSIS — R197 Diarrhea, unspecified: Secondary | ICD-10-CM

## 2015-02-02 DIAGNOSIS — K51219 Ulcerative (chronic) proctitis with unspecified complications: Secondary | ICD-10-CM

## 2015-02-02 LAB — CBC WITH DIFFERENTIAL/PLATELET
BASOS PCT: 0.5 % (ref 0.0–3.0)
Basophils Absolute: 0 10*3/uL (ref 0.0–0.1)
EOS ABS: 0.2 10*3/uL (ref 0.0–0.7)
EOS PCT: 2.1 % (ref 0.0–5.0)
HEMATOCRIT: 45.1 % (ref 36.0–46.0)
HEMOGLOBIN: 14.9 g/dL (ref 12.0–15.0)
LYMPHS PCT: 16.4 % (ref 12.0–46.0)
Lymphs Abs: 1.2 10*3/uL (ref 0.7–4.0)
MCHC: 32.9 g/dL (ref 30.0–36.0)
MCV: 90.2 fl (ref 78.0–100.0)
MONOS PCT: 5.6 % (ref 3.0–12.0)
Monocytes Absolute: 0.4 10*3/uL (ref 0.1–1.0)
Neutro Abs: 5.7 10*3/uL (ref 1.4–7.7)
Neutrophils Relative %: 75.4 % (ref 43.0–77.0)
Platelets: 354 10*3/uL (ref 150.0–400.0)
RBC: 5.01 Mil/uL (ref 3.87–5.11)
RDW: 14.5 % (ref 11.5–15.5)
WBC: 7.6 10*3/uL (ref 4.0–10.5)

## 2015-02-02 LAB — BASIC METABOLIC PANEL
BUN: 22 mg/dL (ref 6–23)
CO2: 30 mEq/L (ref 19–32)
Calcium: 9.8 mg/dL (ref 8.4–10.5)
Chloride: 103 mEq/L (ref 96–112)
Creatinine, Ser: 0.72 mg/dL (ref 0.40–1.20)
GFR: 83.46 mL/min (ref >60.00)
GLUCOSE: 81 mg/dL (ref 70–99)
POTASSIUM: 4 meq/L (ref 3.5–5.1)
Sodium: 141 mEq/L (ref 135–145)

## 2015-02-02 NOTE — Telephone Encounter (Signed)
See below. Blood work is resulted. Stool study is pending.

## 2015-02-02 NOTE — Telephone Encounter (Signed)
CBC and BMP normal. Ok to take Zofran and tums PRN. Advise her to continue Zantac BID.

## 2015-02-02 NOTE — Telephone Encounter (Signed)
Spoke with the patient. She has a burning acid that comes up when she has a bowel movement. She did this twice yesterday. She took a Nurse, adult today before her bowel movement. This seemed to help. There is not any lingering nausea. Her bowel movements are not formed. She has turned in her specimen. She does have Zofran on hand and understands how to use this.

## 2015-02-03 ENCOUNTER — Telehealth: Payer: Self-pay

## 2015-02-03 ENCOUNTER — Other Ambulatory Visit: Payer: Self-pay

## 2015-02-03 DIAGNOSIS — K625 Hemorrhage of anus and rectum: Secondary | ICD-10-CM | POA: Insufficient documentation

## 2015-02-03 DIAGNOSIS — A048 Other specified bacterial intestinal infections: Secondary | ICD-10-CM | POA: Insufficient documentation

## 2015-02-03 LAB — CLOSTRIDIUM DIFFICILE BY PCR: CDIFFPCR: DETECTED — AB

## 2015-02-03 MED ORDER — METRONIDAZOLE 500 MG PO TABS: 500.0000 mg | ORAL_TABLET | Freq: Three times a day (TID) | ORAL | Status: DC

## 2015-02-03 NOTE — Telephone Encounter (Signed)
Patient advised of positive C-diff results. Instructed on self care and precautions. Flagyl called to [facility] as per Alonza Bogus, PA

## 2015-02-03 NOTE — Progress Notes (Signed)
Reviewed and agree with documentation and assessment and plan. C.diff positive. Will treat C.diff colitis and can hold off starting Budesonide K. Denzil Magnuson , MD

## 2015-02-03 NOTE — Progress Notes (Signed)
     01/22/2015 Erika Fowler [id number] [birth year]   History of Present Illness:  Erika Fowler is a very nice 79 year old white female former patient of Dr. [name] with long history of ulcerative colitis, left-sided diagnosed approximately 40 years ago. Her last colonoscopy was done in January 2014 and she had mild chronic active colitis of the left colon. Biopsies were taken from the right colon and were negative. She had a mild flare in March 2016 and responded to a course of budesonide. She stays on Apriso for maintenance, 3 tablets twice daily.  Her care will be assumed by Dr. Silverio Decamp.  She presents to our office today with complaints of diarrhea and rectal bleeding.  Passing only blood at times with liquid stool.  Present for just the past few days.  Has some lower abdominal cramping with urgency as well.  CBC and BMP yesterday were ok.  Cdiff not done because she was confused since she just turned in a stool specimen for Hpylori yesterday and thought that they were the same thing.   Current Medications, Allergies, Past Medical History, Past Surgical History, Family History and Social History were reviewed in [employer] record.   Physical Exam: BP 130/86 mmHg  Pulse 84  Ht 4\' 11"  (1.499 m)  Wt 132 lb (59.875 kg)  BMI 26.65 kg/m2 General: Well developed white female in no acute distress Head: Normocephalic and atraumatic Eyes:  Sclerae anicteric, conjunctiva pink  Ears: Normal auditory acuity Lungs: Clear throughout to auscultation Heart: Regular rate and rhythm Abdomen: Soft, non-distended.  Normal bowel sounds.  Non-tender. Musculoskeletal: Symmetrical with no gross deformities  Extremities: No edema  Neurological: Alert oriented x 4, grossly non-focal Psychological:  Alert and cooperative. Normal mood and affect  Assessment and Recommendations: -79 year old female with long history of ulcerative colitis, now with symptoms c/w flare with diarrhea and  rectal bleeding.  Will check stool for Cdiff.  Will start budesonide 9 mg daily as she has seemed to respond to this in the past.  She will call back in 2 weeks with an update and ask for [facility].  She will also be scheduled for a colonoscopy since last was almost 3 years ago plus will need to assess activity of colitis.  Thinks that [place] worked better than her Dorthy Cooler, but Doristine Johns was taken of her formulary previously; ? If we will need to switch back or change therapy pending results/respose of above. -History of Hpylori:  Will check stool Ag once acute colitis symptoms improve/resolve.

## 2015-02-06 ENCOUNTER — Telehealth: Payer: Self-pay | Admitting: Gastroenterology

## 2015-02-06 ENCOUNTER — Telehealth: Payer: Self-pay | Admitting: *Deleted

## 2015-02-06 MED ORDER — VANCOMYCIN HCL 125 MG PO CAPS: ORAL_CAPSULE | ORAL | Status: DC

## 2015-02-06 NOTE — Telephone Encounter (Signed)
Patient states she is not any better since starting the Flagyl for C. Diff. States she is still having diarrhea every time she eats. Also, states she is still vomiting after she eats also. States she is using Zofran. Please, advise.

## 2015-02-06 NOTE — Telephone Encounter (Signed)
We could switch to vancomycin instead 125 mg four times daily for 14 days.  Is she having any abdominal pain?  Is Zofran working?  Thank you,  Jess

## 2015-02-06 NOTE — Telephone Encounter (Signed)
Patient notified of recommendations. 

## 2015-02-06 NOTE — Telephone Encounter (Signed)
Patient given recommendation. Rx sent. Patient states her stomach is sore and her Zofran does help.

## 2015-02-06 NOTE — Telephone Encounter (Signed)
-----   Message from Loralie Champagne, PA-C sent at 02/06/2015  3:56 PM EST ----- Please have her call back with an update again on 1/4 since she is scheduled for a colonoscopy in mid-January I want her to check back in with [place] to be sure she is getting better from the [place] before that.  Obviously call back sooner or go to the ED if worsening.  Thank you,  Jess

## 2015-02-07 ENCOUNTER — Encounter (HOSPITAL_COMMUNITY): Payer: Self-pay | Admitting: Emergency Medicine

## 2015-02-07 ENCOUNTER — Emergency Department (HOSPITAL_COMMUNITY): Payer: Medicare Other

## 2015-02-07 ENCOUNTER — Emergency Department (HOSPITAL_COMMUNITY)
Admission: EM | Admit: 2015-02-07 | Discharge: 2015-02-07 | Disposition: A | Discharge status: Home / Self Care | Payer: Medicare Other | Attending: Physician Assistant | Admitting: Physician Assistant

## 2015-02-07 DIAGNOSIS — R109 Unspecified abdominal pain: Secondary | ICD-10-CM | POA: Diagnosis not present

## 2015-02-07 DIAGNOSIS — Z8619 Personal history of other infectious and parasitic diseases: Secondary | ICD-10-CM | POA: Insufficient documentation

## 2015-02-07 DIAGNOSIS — Z8719 Personal history of other diseases of the digestive system: Secondary | ICD-10-CM | POA: Insufficient documentation

## 2015-02-07 DIAGNOSIS — R112 Nausea with vomiting, unspecified: Secondary | ICD-10-CM | POA: Diagnosis not present

## 2015-02-07 DIAGNOSIS — R110 Nausea: Secondary | ICD-10-CM

## 2015-02-07 DIAGNOSIS — R197 Diarrhea, unspecified: Secondary | ICD-10-CM | POA: Diagnosis not present

## 2015-02-07 HISTORY — DX: Other specified bacterial intestinal infections: A04.8

## 2015-02-07 HISTORY — DX: Enterocolitis due to Clostridium difficile, not specified as recurrent: A04.72

## 2015-02-07 LAB — CBC
HEMATOCRIT: 45.9 % (ref 36.0–46.0)
Hemoglobin: 15.1 g/dL — ABNORMAL HIGH (ref 12.0–15.0)
MCH: 30.2 pg (ref 26.0–34.0)
MCHC: 32.9 g/dL (ref 30.0–36.0)
MCV: 91.8 fL (ref 78.0–100.0)
Platelets: 364 10*3/uL (ref 150–400)
RBC: 5 MIL/uL (ref 3.87–5.11)
RDW: 14.2 % (ref 11.5–15.5)
WBC: 8.8 10*3/uL (ref 4.0–10.5)

## 2015-02-07 LAB — COMPREHENSIVE METABOLIC PANEL
ALBUMIN: 3.8 g/dL (ref 3.5–5.0)
ALT: 21 U/L (ref 14–54)
AST: 32 U/L (ref 15–41)
Alkaline Phosphatase: 68 U/L (ref 38–126)
Anion gap: 7 (ref 5–15)
BUN: 15 mg/dL (ref 6–20)
CHLORIDE: 105 mmol/L (ref 101–111)
CO2: 30 mmol/L (ref 22–32)
CREATININE: 0.71 mg/dL (ref 0.44–1.00)
Calcium: 9.1 mg/dL (ref 8.9–10.3)
GFR calc Af Amer: >60 mL/min (ref >60)
GFR calc non Af Amer: >60 mL/min (ref >60)
Glucose, Bld: 108 mg/dL — ABNORMAL HIGH (ref 65–99)
Potassium: 4 mmol/L (ref 3.5–5.1)
SODIUM: 142 mmol/L (ref 135–145)
Total Bilirubin: 0.7 mg/dL (ref 0.3–1.2)
Total Protein: 7 g/dL (ref 6.5–8.1)

## 2015-02-07 LAB — URINE MICROSCOPIC-ADD ON

## 2015-02-07 LAB — URINALYSIS, ROUTINE W REFLEX MICROSCOPIC
BILIRUBIN URINE: NEGATIVE
Glucose, UA: NEGATIVE mg/dL
Ketones, ur: NEGATIVE mg/dL
Nitrite: NEGATIVE
PH: 5.5 (ref 5.0–8.0)
Protein, ur: NEGATIVE mg/dL
SPECIFIC GRAVITY, URINE: 1.015 (ref 1.005–1.030)

## 2015-02-07 LAB — I-STAT TROPONIN, ED: Troponin i, poc: 0 ng/mL (ref 0.00–0.08)

## 2015-02-07 LAB — LIPASE, BLOOD: LIPASE: 50 U/L (ref 11–51)

## 2015-02-07 MED ORDER — VANCOMYCIN HCL 500 MG IV SOLR: 500.0000 mg | Freq: Once | INTRAVENOUS | Status: DC

## 2015-02-07 MED ORDER — ONDANSETRON HCL 4 MG/2ML IJ SOLN
4.0000 mg | Freq: Once | INTRAMUSCULAR | Status: AC
  Administered 2015-02-07: 4 mg via INTRAVENOUS
  Filled 2015-02-07: qty 2

## 2015-02-07 MED ORDER — SODIUM CHLORIDE 0.9 % IV BOLUS (SEPSIS)
1000.0000 mL | Freq: Once | INTRAVENOUS | Status: AC
  Administered 2015-02-07: 1000 mL via INTRAVENOUS

## 2015-02-07 NOTE — Discharge Instructions (Signed)
Nausea, Adult °Nausea is the feeling that you have an upset stomach or have to vomit. Nausea by itself is not likely a serious concern, but it may be an early sign of more serious medical problems. As nausea gets worse, it can lead to vomiting. If vomiting develops, there is the risk of dehydration.  °CAUSES  °· Viral infections. °· Food poisoning. °· Medicines. °· Pregnancy. °· Motion sickness. °· Migraine headaches. °· Emotional distress. °· Severe pain from any source. °· Alcohol intoxication. °HOME CARE INSTRUCTIONS °· Get plenty of rest. °· Ask your caregiver about specific rehydration instructions. °· Eat small amounts of food and sip liquids more often. °· Take all medicines as told by your caregiver. °SEEK MEDICAL CARE IF: °· You have not improved after 2 days, or you get worse. °· You have a headache. °SEEK IMMEDIATE MEDICAL CARE IF:  °· You have a fever. °· You faint. °· You keep vomiting or have blood in your vomit. °· You are extremely weak or dehydrated. °· You have dark or bloody stools. °· You have severe chest or abdominal pain. °MAKE SURE YOU: °· Understand these instructions. °· Will watch your condition. °· Will get help right away if you are not doing well or get worse. °  °This information is not intended to replace advice given to you by your health care provider. Make sure you discuss any questions you have with your health care provider. °  °Document Released: 03/10/2004 Document Revised: 02/21/2014 Document Reviewed: 10/13/2010 °Elsevier Interactive Patient Education ©2016 [employer] ° °

## 2015-02-07 NOTE — ED Notes (Signed)
She reports having known c.diff. And was unable to tolerate p.o. Flagyl.  She recently started p.o. Vanc.  Today she c/o inablility to consume adequate fluids, plus "burning in her epigastrium and chest ever since I started the Vancomycin".

## 2015-02-07 NOTE — ED Notes (Signed)
Bed: [id number] Expected date:  Expected time:  Means of arrival:  Comments: Hold for triage 2

## 2015-02-07 NOTE — ED Provider Notes (Signed)
CSN: [id number]     Arrival date & time 02/07/15  1204 History   First MD Initiated Contact with Patient 02/07/15 1333     Chief Complaint  Patient presents with  . Abdominal Pain  . Chest Pain   (Consider location/radiation/quality/duration/timing/severity/associated sxs/prior Treatment) The history is provided by the patient and a relative. No language interpreter was used.    Erika Fowler is a 79 y.o female with a history of ulcerative colitis and H. pylori infection who had an endoscopy 3 weeks ago and was told that she had C. difficile. She was put on antibiotics which upset her stomach. The antibiotic was changed a week ago to Flagyl and the patient had an upset stomach with this also. The antibiotic was changed 2 days ago and she has since been on oral vancomycin. She is complaining of nausea and one episode of vomiting yesterday. She also reports diarrhea for the past 2 weeks. She states she has not had a solid bowel movement and noticed some bloody bowel movements but states this is normal for her due to her ulcerative colitis. She has Zofran at home and has taken this but it has not helped.  She denies any fever, chills, night sweats, abdominal distention, constipation, inability to pass gas, dysuria.  Past Medical History  Diagnosis Date  . H. pylori infection   . C. difficile enteritis    Past Surgical History  Procedure Laterality Date  . Colonoscopy    . Ulcerative colitis     No family history on file. Social History  Substance Use Topics  . Smoking status: Never Smoker   . Smokeless tobacco: None  . Alcohol Use: No   OB History    No data available     Review of Systems  Constitutional: Negative for fever.  Gastrointestinal: Positive for nausea, vomiting, abdominal pain and diarrhea.  All other systems reviewed and are negative.     Allergies  Review of patient's allergies indicates no known allergies.  Home Medications   Prior to Admission medications    Not on File   BP 136/76 mmHg  Pulse 85  Temp(Src) 98.3 F (36.8 C) (Oral)  Resp 16  SpO2 96% Physical Exam  Constitutional: She is oriented to person, place, and time. She appears well-developed and well-nourished. No distress.  HENT:  Head: Normocephalic and atraumatic.  Eyes: Conjunctivae are normal.  Neck: Normal range of motion. Neck supple.  Cardiovascular: Normal rate, regular rhythm and normal heart sounds.   Pulmonary/Chest: Effort normal and breath sounds normal. No respiratory distress. She has no wheezes.  Abdominal: Soft. She exhibits no distension. There is no tenderness. There is no rebound and no guarding.  No reproducible tenderness on exam. No abdominal distention. Abdomen is soft. No guarding or rebound.  Musculoskeletal: Normal range of motion.  Neurological: She is alert and oriented to person, place, and time.  Skin: Skin is warm and dry.  Psychiatric: She has a normal mood and affect.  Nursing note and vitals reviewed.   ED Course  Procedures (including critical care time) Labs Review Labs Reviewed  CBC - Abnormal; Notable for the following:    Hemoglobin 15.1 (*)    All other components within normal limits  COMPREHENSIVE METABOLIC PANEL - Abnormal; Notable for the following:    Glucose, Bld 108 (*)    All other components within normal limits  URINALYSIS, ROUTINE W REFLEX MICROSCOPIC (NOT AT [facility]) - Abnormal; Notable for the following:    APPearance CLOUDY (*)  Hgb urine dipstick SMALL (*)    Leukocytes, UA SMALL (*)    All other components within normal limits  URINE MICROSCOPIC-ADD ON - Abnormal; Notable for the following:    Squamous Epithelial / LPF 0-5 (*)    Bacteria, UA FEW (*)    All other components within normal limits  LIPASE, BLOOD  I-STAT TROPOININ, ED    Imaging Review Dg Chest 2 View  02/07/2015  CLINICAL DATA:  Central chest pain and burning.  Initial encounter. EXAM: CHEST  2 VIEW COMPARISON:  None. FINDINGS: The lungs  are clear. Heart size is normal. No pneumothorax or pleural effusion. Scoliosis noted. IMPRESSION: No acute disease. Electronically Signed   By: Inge Rise M.D.   On: 02/07/2015 12:51   I have personally reviewed and evaluated these images and lab results as part of my medical decision-making.  ED ECG REPORT   Date: 02/08/2015  Rate: 84   Rhythm: normal sinus rhythm  QRS Axis: normal  Intervals: normal  ST/T Wave abnormalities: normal  Conduction Disutrbances:none  Narrative Interpretation:   Old EKG Reviewed: None available.  I have personally reviewed the EKG tracing and agree with the computerized printout as noted.   MDM   Final diagnoses:  Nausea   Patient presents for nausea, one episode of vomiting yesterday, diarrhea, and abdominal bloating. She was recently diagnosed with C. difficile and tried to take Flagyl but it caused her stomach to be upset. She also tried another antibiotic but is unaware of the name. This also causes same symptoms. 2 days ago she was changed to vancomycin and has had nausea. She was prescribed Zofran and took that yesterday and today without relief. Her labs today are unremarkable. EKG was protocol order and is normal. Troponin and chest xray was ordered in triage and is also negative.  I do not believe this is cardiac related.  Her vital signs are stable, she is afebrile, and well-appearing.  Recheck: After fluids and IV Zofran the patient is feeling much better. I believe she can go home with GI follow-up. Her and her son-in-law are agreeable to the plan. Medications  sodium chloride 0.9 % bolus 1,000 mL (0 mLs Intravenous Stopped 02/07/15 1609)  ondansetron (ZOFRAN) injection 4 mg (4 mg Intravenous Given 02/07/15 1429)   Filed Vitals:   02/07/15 1422 02/07/15 1610  BP: 133/84 136/76  Pulse: 71 85  Temp:  98.3 F (36.8 C)  Resp: 16 16         Erika Dancer Patel-Mills, PA-C 02/08/15 0800  Courteney Lyn Mackuen, MD 02/08/15 1516

## 2015-02-09 ENCOUNTER — Encounter: Payer: Self-pay | Admitting: Gastroenterology

## 2015-02-17 ENCOUNTER — Other Ambulatory Visit: Payer: Self-pay | Admitting: Emergency Medicine

## 2015-02-17 DIAGNOSIS — A048 Other specified bacterial intestinal infections: Secondary | ICD-10-CM

## 2015-02-19 ENCOUNTER — Inpatient Hospital Stay (HOSPITAL_COMMUNITY)
Admission: EM | Admit: 2015-02-19 | Discharge: 2015-02-22 | DRG: 387 | Disposition: A | Discharge status: Home / Self Care | Payer: Medicare Other | Attending: Internal Medicine | Admitting: Internal Medicine

## 2015-02-19 ENCOUNTER — Emergency Department (HOSPITAL_COMMUNITY): Payer: Medicare Other

## 2015-02-19 ENCOUNTER — Encounter (HOSPITAL_COMMUNITY): Payer: Self-pay | Admitting: Nurse Practitioner

## 2015-02-19 DIAGNOSIS — Z803 Family history of malignant neoplasm of breast: Secondary | ICD-10-CM

## 2015-02-19 DIAGNOSIS — R630 Anorexia: Secondary | ICD-10-CM | POA: Diagnosis present

## 2015-02-19 DIAGNOSIS — K51319 Ulcerative (chronic) rectosigmoiditis with unspecified complications: Secondary | ICD-10-CM

## 2015-02-19 DIAGNOSIS — D72829 Elevated white blood cell count, unspecified: Secondary | ICD-10-CM | POA: Diagnosis not present

## 2015-02-19 DIAGNOSIS — K5190 Ulcerative colitis, unspecified, without complications: Principal | ICD-10-CM | POA: Diagnosis present

## 2015-02-19 DIAGNOSIS — E876 Hypokalemia: Secondary | ICD-10-CM | POA: Diagnosis present

## 2015-02-19 DIAGNOSIS — A084 Viral intestinal infection, unspecified: Secondary | ICD-10-CM | POA: Diagnosis present

## 2015-02-19 DIAGNOSIS — M8580 Other specified disorders of bone density and structure, unspecified site: Secondary | ICD-10-CM | POA: Diagnosis not present

## 2015-02-19 DIAGNOSIS — Z880 Allergy status to penicillin: Secondary | ICD-10-CM

## 2015-02-19 DIAGNOSIS — K529 Noninfective gastroenteritis and colitis, unspecified: Secondary | ICD-10-CM | POA: Diagnosis present

## 2015-02-19 DIAGNOSIS — Z800 Family history of malignant neoplasm of digestive organs: Secondary | ICD-10-CM

## 2015-02-19 DIAGNOSIS — K625 Hemorrhage of anus and rectum: Secondary | ICD-10-CM | POA: Diagnosis present

## 2015-02-19 DIAGNOSIS — R17 Unspecified jaundice: Secondary | ICD-10-CM | POA: Diagnosis present

## 2015-02-19 DIAGNOSIS — Z79899 Other long term (current) drug therapy: Secondary | ICD-10-CM

## 2015-02-19 DIAGNOSIS — F419 Anxiety disorder, unspecified: Secondary | ICD-10-CM | POA: Diagnosis present

## 2015-02-19 DIAGNOSIS — Z8249 Family history of ischemic heart disease and other diseases of the circulatory system: Secondary | ICD-10-CM

## 2015-02-19 LAB — COMPREHENSIVE METABOLIC PANEL
ALK PHOS: 70 U/L (ref 38–126)
ALT: 18 U/L (ref 14–54)
AST: 22 U/L (ref 15–41)
Albumin: 3.9 g/dL (ref 3.5–5.0)
Anion gap: 10 (ref 5–15)
BILIRUBIN TOTAL: 1.5 mg/dL — AB (ref 0.3–1.2)
BUN: 6 mg/dL (ref 6–20)
CALCIUM: 8.9 mg/dL (ref 8.9–10.3)
CHLORIDE: 104 mmol/L (ref 101–111)
CO2: 26 mmol/L (ref 22–32)
CREATININE: 0.66 mg/dL (ref 0.44–1.00)
Glucose, Bld: 100 mg/dL — ABNORMAL HIGH (ref 65–99)
Potassium: 3.7 mmol/L (ref 3.5–5.1)
Sodium: 140 mmol/L (ref 135–145)
Total Protein: 7 g/dL (ref 6.5–8.1)

## 2015-02-19 LAB — URINALYSIS, ROUTINE W REFLEX MICROSCOPIC
BILIRUBIN URINE: NEGATIVE
GLUCOSE, UA: NEGATIVE mg/dL
Hgb urine dipstick: NEGATIVE
KETONES UR: NEGATIVE mg/dL
NITRITE: NEGATIVE
PROTEIN: NEGATIVE mg/dL
Specific Gravity, Urine: 1.003 — ABNORMAL LOW (ref 1.005–1.030)
pH: 6.5 (ref 5.0–8.0)

## 2015-02-19 LAB — CBC WITH DIFFERENTIAL/PLATELET
BASOS ABS: 0 10*3/uL (ref 0.0–0.1)
Basophils Relative: 0 %
EOS PCT: 1 %
Eosinophils Absolute: 0.1 10*3/uL (ref 0.0–0.7)
HEMATOCRIT: 44.7 % (ref 36.0–46.0)
HEMOGLOBIN: 14.6 g/dL (ref 12.0–15.0)
LYMPHS ABS: 1.1 10*3/uL (ref 0.7–4.0)
LYMPHS PCT: 10 %
MCH: 29.8 pg (ref 26.0–34.0)
MCHC: 32.7 g/dL (ref 30.0–36.0)
MCV: 91.2 fL (ref 78.0–100.0)
Monocytes Absolute: 0.5 10*3/uL (ref 0.1–1.0)
Monocytes Relative: 5 %
NEUTROS ABS: 8.9 10*3/uL — AB (ref 1.7–7.7)
Neutrophils Relative %: 84 %
Platelets: 354 10*3/uL (ref 150–400)
RBC: 4.9 MIL/uL (ref 3.87–5.11)
RDW: 14.1 % (ref 11.5–15.5)
WBC: 10.6 10*3/uL — AB (ref 4.0–10.5)

## 2015-02-19 LAB — URINE MICROSCOPIC-ADD ON: RBC / HPF: NONE SEEN RBC/hpf (ref 0–5)

## 2015-02-19 LAB — POC OCCULT BLOOD, ED: FECAL OCCULT BLD: NEGATIVE

## 2015-02-19 MED ORDER — METRONIDAZOLE IN NACL 5-0.79 MG/ML-% IV SOLN
500.0000 mg | Freq: Three times a day (TID) | INTRAVENOUS | Status: DC
  Administered 2015-02-20 – 2015-02-21 (×5): 500 mg via INTRAVENOUS
  Filled 2015-02-19 (×6): qty 100

## 2015-02-19 MED ORDER — SODIUM CHLORIDE 0.9 % IV BOLUS (SEPSIS)
1000.0000 mL | Freq: Once | INTRAVENOUS | Status: AC
  Administered 2015-02-19: 1000 mL via INTRAVENOUS

## 2015-02-19 MED ORDER — SODIUM CHLORIDE 0.9 % IV SOLN
INTRAVENOUS | Status: DC
  Administered 2015-02-19: 23:00:00 via INTRAVENOUS
  Administered 2015-02-20: 125 mL/h via INTRAVENOUS
  Administered 2015-02-22: 05:00:00 via INTRAVENOUS

## 2015-02-19 MED ORDER — ONDANSETRON HCL 4 MG/2ML IJ SOLN
4.0000 mg | Freq: Once | INTRAMUSCULAR | Status: AC
  Administered 2015-02-19: 4 mg via INTRAVENOUS
  Filled 2015-02-19: qty 2

## 2015-02-19 MED ORDER — IOHEXOL 300 MG/ML  SOLN
100.0000 mL | Freq: Once | INTRAMUSCULAR | Status: AC | PRN
  Administered 2015-02-19: 100 mL via INTRAVENOUS

## 2015-02-19 MED ORDER — METHYLPREDNISOLONE SODIUM SUCC 125 MG IJ SOLR
60.0000 mg | INTRAMUSCULAR | Status: AC
  Administered 2015-02-19: 60 mg via INTRAVENOUS
  Filled 2015-02-19: qty 2

## 2015-02-19 NOTE — ED Provider Notes (Signed)
CSN: [id number]     Arrival date & time 02/19/15  1836 History   First MD Initiated Contact with Patient 02/19/15 1916     Chief Complaint  Patient presents with  . Diarrhea    C. diff     (Consider location/radiation/quality/duration/timing/severity/associated sxs/prior Treatment) HPI Comments: Pt being tx for cdiff with vancomycin (finishing tx tomorrow), now w/ temp to 100.5 and non-bilious emesis x1  abd pain is crampy and diffuse, denies urinary sx, no cough  Pt has UC and chronically has blood streaked stools and today is no different Denies weakness, syncope or near syncope  Patient is a 80 y.o. female presenting with diarrhea. The history is provided by the patient.  Diarrhea Quality:  Watery Severity:  Moderate Onset quality:  Gradual Timing:  Constant Progression:  Worsening Relieved by:  Nothing Associated symptoms: abdominal pain, chills, fever and vomiting     Past Medical History  Diagnosis Date  . Colitis, ulcerative ([place])   . Osteopenia   . Uterine prolapse   . Anxiety   . 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 Cellulitis 05/10/14  . H. pylori infection   . C. difficile enteritis    Past Surgical History  Procedure Laterality Date  . Tonsillectomy    . Colonoscopy    . Ulcerative colitis     Family History  Problem Relation Age of Onset  . Colon cancer Mother 49  . Heart disease Mother   . Breast cancer Mother     Age 34's  . Pancreatic cancer Father   . Breast cancer Sister     Age 18's  . Hypertension Sister   . Breast cancer Maternal Aunt     Age 22's  . Breast cancer Paternal Aunt     Age 107's  . Inflammatory bowel disease Sister   . Breast cancer Maternal Aunt     Age 40's  . Stomach cancer Neg Hx    Social History  Substance Use Topics  . Smoking status: Never Smoker   . Smokeless tobacco: None  . Alcohol Use: No     Comment: wine    OB History    Gravida Para Term Preterm AB TAB SAB Ectopic Multiple Living   '2 2 2  '$ 0 0 0 0 0       Review of Systems  Constitutional: Positive for fever and chills.  Gastrointestinal: Positive for vomiting, abdominal pain and diarrhea.  All other systems reviewed and are negative.     Allergies  Penicillins  Home Medications   Prior to Admission medications   Medication Sig Start Date End Date Taking? Authorizing Provider  Cholecalciferol (VITAMIN D) 2000 UNITS tablet Take 2,000 Units by mouth daily.      Historical Provider, MD  escitalopram (LEXAPRO) 10 MG tablet TAKE 1 TABLET DAILY 08/13/14   [name], MD  mesalamine (APRISO) 0.375 G 24 hr capsule Take 3 tablets BID. (6 tablets/day) 12/09/14   Mauri Pole, MD  Multiple Vitamins-Minerals (PRESERVISION/LUTEIN PO) Take by mouth.      Historical Provider, MD  Na Sulfate-K Sulfate-Mg Sulf SOLN Take 1 kit by mouth as needed. 01/22/15 02/21/15  Jessica D Zehr, PA-C  ondansetron (ZOFRAN) 4 MG tablet Take 1 tab every 6-8 hours as needed for nausea. 10/27/14   Amy S Esterwood, PA-C  raloxifene (EVISTA) 60 MG tablet Take 60 mg by mouth daily. 12/19/13   Historical Provider, MD  ranitidine (ZANTAC) 150 MG tablet Take 1 tablet (150  mg total) by mouth 2 (two) times daily. 04/22/14   [name], MD  vancomycin (VANCOCIN HCL) 125 MG capsule Take one po QID x 14 days 02/06/15   Laban Emperor Zehr, PA-C   BP 111/76 mmHg  Pulse 93  Temp(Src) 98.5 F (36.9 C) (Oral)  Resp 15  SpO2 95% Physical Exam  Constitutional: She is oriented to person, place, and time. She appears well-developed and well-nourished.  Non-toxic appearance. No distress.  HENT:  Head: Normocephalic and atraumatic.  Eyes: Conjunctivae, EOM and lids are normal. Pupils are equal, round, and reactive to light.  Neck: Normal range of motion. Neck supple. No tracheal deviation present. No thyroid mass present.  Cardiovascular: Normal rate, regular rhythm and normal heart sounds.  Exam reveals no gallop.   No murmur heard. Pulmonary/Chest: Effort normal and  breath sounds normal. No stridor. No respiratory distress. She has no decreased breath sounds. She has no wheezes. She has no rhonchi. She has no rales.  Abdominal: Soft. Normal appearance and bowel sounds are normal. She exhibits no distension. There is no tenderness. There is no rebound and no CVA tenderness.  Musculoskeletal: Normal range of motion. She exhibits no edema or tenderness.  Neurological: She is alert and oriented to person, place, and time. She has normal strength. No cranial nerve deficit or sensory deficit. GCS eye subscore is 4. GCS verbal subscore is 5. GCS motor subscore is 6.  Skin: Skin is warm and dry. No abrasion and no rash noted.  Psychiatric: She has a normal mood and affect. Her speech is normal and behavior is normal.  Nursing note and vitals reviewed.   ED Course  Procedures (including critical care time) Labs Review Labs Reviewed  GASTROINTESTINAL PANEL BY PCR, STOOL (REPLACES STOOL CULTURE)  CBC WITH DIFFERENTIAL/PLATELET  COMPREHENSIVE METABOLIC PANEL  URINALYSIS, ROUTINE W REFLEX MICROSCOPIC (NOT AT [facility])  POC OCCULT BLOOD, ED    Imaging Review No results found. I have personally reviewed and evaluated these images and lab results as part of my medical decision-making.   EKG Interpretation None      MDM   Final diagnoses:  None    Patient's abdominal CT consistent with colitis. Discuss with hospitalist and patient will be admitted for IV antibiotics    Erika Leigh, MD 02/19/15 2215

## 2015-02-19 NOTE — ED Notes (Signed)
80 yo female with a history of ulcerative colitis and H. pylori infection who had an endoscopy 4 weeks ago and was told that she had C. difficile. She was put on antibiotics which caused stomach discomfort. The antibiotic was changed a 2 weeks ago to Flagyl and the patient also experienced stomach discomfort. The patient was then put on oral vancomycin and continues to complain of nausea and diarrhea (>3 weeks). She was last seen in the ED on 12/24 and was referred to GI. She saw Dr. Brigitte Pulse of [facility] who did not change any prescriptions and recommended dietary management of the UC and to continue the oral vanc. The patient was scheduled to have another appointment with Dr. Brigitte Pulse tomorrow 1/6. Tomorrow (02/20/15) is her last scheduled dose of oral vanc. She also complains of blood in her stools but feels it is consistent with her ulcerative colitis. She has Zofran at home and has taken this but it has not helped.  She denies any fever, chills, night sweats, abdominal distention, constipation, inability to pass gas, dysuria.

## 2015-02-19 NOTE — H&P (Signed)
[facility] History and Physical  Erika Fowler XFG:[id number] DOB: 1935/11/10 DOA: 02/19/2015  Referring physician: ED PCP: Mayra Neer, MD   Chief Complaint: Abdominal pain diarrhea  HPI:  Ms. Erika Fowler is a 80 year old female with a past medical history significant for ulcerative colitis, H. pylori, C. difficile ; who presents with complaints of abdominal pain, diarrhea, and anorexia. Patient notes that she's been dealing with these symptoms for at least 3 weeks now. Within the last 2 months she was initially diagnosed and treated for cellulitis, H. pylori, and then C. difficile (tested positive on 12/19) .She reports she was initially started on metronidazole orally(started 12/20), but could not tolerate this and therefore was switched over to oral vancomycin(started 12/23). Despite taking medicine as advised she reports continued abdominal pain. Abdominal pain is described as a sore irritated feeling. Whenever she tries to eat anything she experiences either diarrhea or nausea and vomiting. Only able to tolerate eating Jell-O, broth, and crackers over this three week period. Unable to quantify how much weight she has lost, but notes that her normal clothes are falling off her at this point. Patient also endorses associated symptoms of blood per rectum, loose stools, fever. Denies symptoms of chest pain, shortness of breath, frequency of urination. She was scheduled for appointment with her PCP Dr. Manuella Ghazi tomorrow. Her gastroenterologist is Dr. Fleeta Emmer.     Upon admission patient was noted to have a fever over 100.47F. CT scan of the abdomen showed inflammation of the rectosigmoid colon.    Review of Systems  Constitutional: Positive for fever and weight loss. Negative for chills.  HENT: Negative for ear discharge and ear pain.   Eyes: Negative for photophobia and pain.  Respiratory: Negative for cough, shortness of breath and wheezing.   Cardiovascular: Negative for chest pain,  palpitations and leg swelling.  Gastrointestinal: Positive for nausea, vomiting, abdominal pain, diarrhea and blood in stool.  Genitourinary: Negative for urgency and frequency.  Musculoskeletal: Negative for back pain and neck pain.  Skin: Negative for itching and rash.  Neurological: Positive for weakness. Negative for tremors and sensory change.  Endo/Heme/Allergies: Negative for environmental allergies and polydipsia.  Psychiatric/Behavioral: Negative for hallucinations and substance abuse.     Past Medical History  Diagnosis Date  . Colitis, ulcerative ([place])   . Osteopenia   . Uterine prolapse   . Anxiety   . 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 Cellulitis 05/10/14  . H. pylori infection   . C. difficile enteritis      Past Surgical History  Procedure Laterality Date  . Tonsillectomy    . Colonoscopy    . Ulcerative colitis        Social History:  reports that she has never smoked. She does not have any smokeless tobacco history on file. She reports that she does not drink alcohol or use illicit drugs. Where does patient live--home and with whom if at home? Husband Can patient participate in ADLs? Yes  Allergies  Allergen Reactions  . Penicillins     Per pt: unknown    Family History  Problem Relation Age of Onset  . Colon cancer Mother 72  . Heart disease Mother   . Breast cancer Mother     Age 42's  . Pancreatic cancer Father   . Breast cancer Sister     Age 49's  . Hypertension Sister   . Breast cancer Maternal Aunt     Age 83's  . Breast cancer Paternal Aunt  Age 81's  . Inflammatory bowel disease Sister   . Breast cancer Maternal Aunt     Age 33's  . Stomach cancer Neg Hx       Prior to Admission medications   Medication Sig Start Date End Date Taking? Authorizing Provider  Cholecalciferol (VITAMIN D) 2000 UNITS tablet Take 2,000 Units by mouth daily.     Yes Historical Provider, MD  escitalopram (LEXAPRO) 10 MG tablet TAKE 1  TABLET DAILY 08/13/14  Yes [name], MD  mesalamine (APRISO) 0.375 G 24 hr capsule Take 3 tablets BID. (6 tablets/day) Patient taking differently: Take 1,125 mg by mouth 2 (two) times daily. Take 3 tablets BID. (6 tablets/day) 12/09/14  Yes Mauri Pole, MD  Multiple Vitamins-Minerals (PRESERVISION/LUTEIN PO) Take 1 tablet by mouth daily.    Yes Historical Provider, MD  ondansetron (ZOFRAN) 4 MG tablet Take 1 tab every 6-8 hours as needed for nausea. 10/27/14  Yes Amy S Esterwood, PA-C  ranitidine (ZANTAC) 150 MG tablet Take 1 tablet (150 mg total) by mouth 2 (two) times daily. 04/22/14  Yes [name], MD  vancomycin (VANCOCIN HCL) 125 MG capsule Take one po QID x 14 days Patient taking differently: Take 125 mg by mouth 4 (four) times daily. Take for 14 days starting 12/23 02/06/15  Yes Jessica D Zehr, PA-C  Na Sulfate-K Sulfate-Mg Sulf SOLN Take 1 kit by mouth as needed. Patient not taking: Reported on 02/19/2015 01/22/15 02/21/15  Laban Emperor Zehr, PA-C  raloxifene (EVISTA) 60 MG tablet Take 60 mg by mouth daily. 12/19/13   Historical Provider, MD     Physical Exam: Filed Vitals:   02/19/15 1913  BP: 111/76  Pulse: 93  Temp: 98.5 F (36.9 C)  TempSrc: Oral  Resp: 15  SpO2: 95%     Constitutional: Vital signs reviewed. Patient is a well-developed and well-nourished in no acute distress and cooperative with exam. Alert and oriented x3.  Head: Normocephalic and atraumatic  Ear: TM normal bilaterally  Mouth: no erythema or exudates, MMM  Eyes: PERRL, EOMI, conjunctivae normal, No scleral icterus.  Neck: Supple, Trachea midline normal ROM, No JVD, mass, thyromegaly, or carotid bruit present.  Cardiovascular: RRR, S1 normal, S2 normal, no MRG, pulses symmetric and intact bilaterally  Pulmonary/Chest: CTAB, no wheezes, rales, or rhonchi  Abdominal: Soft, generalized tenderness to palpation in the lower quadrants, bowel sounds are normal, no masses, organomegaly, or guarding present.   GU: no CVA tenderness Musculoskeletal: No joint deformities, erythema, or stiffness, ROM full and no nontender Ext: no edema and no cyanosis, pulses palpable bilaterally (DP and PT)  Hematology: no cervical, inginal, or axillary adenopathy.  Neurological: A&O x3, Strenght is normal and symmetric bilaterally, cranial nerve II-XII are grossly intact, no focal motor deficit, sensory intact to light touch bilaterally.  Skin: Warm, dry and intact. No rash, cyanosis, or clubbing.  Psychiatric: Normal mood and affect. speech and behavior is normal. Judgment and thought content normal. Cognition and memory are normal.      Data Review   Micro Results No results found for this or any previous visit (from the past 240 hour(s)).  Radiology Reports Dg Chest 2 View  02/07/2015  CLINICAL DATA:  Central chest pain and burning.  Initial encounter. EXAM: CHEST  2 VIEW COMPARISON:  None. FINDINGS: The lungs are clear. Heart size is normal. No pneumothorax or pleural effusion. Scoliosis noted. IMPRESSION: No acute disease. Electronically Signed   By: Inge Rise M.D.   On:  02/07/2015 12:51   Ct Abdomen Pelvis W Contrast  02/19/2015  CLINICAL DATA:  80 year old patient with abdominal pain, diarrhea, and nausea. History of ulcerative colitis and recent diagnosis of C difficile. EXAM: CT ABDOMEN AND PELVIS WITH CONTRAST TECHNIQUE: Multidetector CT imaging of the abdomen and pelvis was performed using the standard protocol following bolus administration of intravenous contrast. CONTRAST:  132m OMNIPAQUE IOHEXOL 300 MG/ML  SOLN COMPARISON:  CT 10/30/2014 FINDINGS: Lower chest: Minimal subsegmental atelectasis at the right lung base and lingula. Heart is normal in size. Liver: Subcentimeter hypodensity adjacent to the gallbladder fossa, unchanged and likely simple cyst. No new hepatic lesion. Hepatobiliary: Gallbladder physiologically distended, no calcified stone. No biliary dilatation. Pancreas: No ductal  dilatation or inflammation. Spleen: Lobular contours, normal in size. Adrenal glands: No nodule. Kidneys: Symmetric renal enhancement and excretion. No hydronephrosis. Cortical scarring in the lower left a 1.3 cm simple cyst in the interpolar right kidney is unchanged. Kidney with calcification, unchanged. Stomach/Bowel: Stomach physiologically distended. There are no dilated or thickened small bowel loops. There is colonic wall thickening involving a moderate length segment of distal sigmoid colon with adjacent soft tissue stranding. Question of rectal wall thickening. Moderate volume of formed stool is seen throughout the remainder the colon. No additional sites of colonic inflammation. The appendix is normal. Vascular/Lymphatic: No retroperitoneal adenopathy. Abdominal aorta is normal in caliber. Minimal atherosclerosis without aneurysm. Reproductive: Uterus and adnexa normal for age. Borderline prominence of left ovarian vein. Bladder: Physiologically distended, no wall thickening. Other: No free air, free fluid, or intra-abdominal fluid collection. Musculoskeletal: There are no acute or suspicious osseous abnormalities. Multifocal degenerative change throughout the included thoracic and lumbar spine, unchanged from prior. IMPRESSION: Moderate segment rectosigmoid colonic wall thickening consistent with colitis. Distribution can be seen with ulcerative or C diff colitis. Moderate volume of formed stool in the proximal colon. No bowel obstruction. Electronically Signed   By: MJeb LeveringM.D.   On: 02/19/2015 21:27     CBC  Recent Labs Lab 02/19/15 1935  WBC 10.6*  HGB 14.6  HCT 44.7  PLT 354  MCV 91.2  MCH 29.8  MCHC 32.7  RDW 14.1  LYMPHSABS 1.1  MONOABS 0.5  EOSABS 0.1  BASOSABS 0.0    Chemistries   Recent Labs Lab 02/19/15 1935  NA 140  K 3.7  CL 104  CO2 26  GLUCOSE 100*  BUN 6  CREATININE 0.66  CALCIUM 8.9  AST 22  ALT 18  ALKPHOS 70  BILITOT 1.5*    ------------------------------------------------------------------------------------------------------------------ CrCl cannot be calculated (Unknown ideal weight.). ------------------------------------------------------------------------------------------------------------------ No results for input(s): HGBA1C in the last 72 hours. ------------------------------------------------------------------------------------------------------------------ No results for input(s): CHOL, HDL, LDLCALC, TRIG, CHOLHDL, LDLDIRECT in the last 72 hours. ------------------------------------------------------------------------------------------------------------------ No results for input(s): TSH, T4TOTAL, T3FREE, THYROIDAB in the last 72 hours.  Invalid input(s): FREET3 ------------------------------------------------------------------------------------------------------------------ No results for input(s): VITAMINB12, FOLATE, FERRITIN, TIBC, IRON, RETICCTPCT in the last 72 hours.  Coagulation profile No results for input(s): INR, PROTIME in the last 168 hours.  No results for input(s): DDIMER in the last 72 hours.  Cardiac Enzymes No results for input(s): CKMB, TROPONINI, MYOGLOBIN in the last 168 hours.  Invalid input(s): CK ------------------------------------------------------------------------------------------------------------------ Invalid input(s): POCBNP   CBG: No results for input(s): GLUCAP in the last 168 hours.     EKG: Independently reviewed.   Assessment/Plan Active Problems:   Colitis: Acute. Patient presents with three-week history of abdominal pain. Generalized tenderness to palpation along the lower quadrants of the abdomen. CT scan  shows inflammation of the rectosigmoid colon. WBC 10.6, hemoglobin 14.6, T.bili  1.5, and the rest of the work relatively normal. Patient is otherwise hemodynamically stable. Question if patient has active flare of UC versus continued C.  difficile.  - Admit to a MedSurg bed  - Consult GI in a.m. ( patient sees Dr. Fleeta Emmer of GI) - IVF normal saline - IV Solu-Medrol 52m 1 dose now  - IV metronidazole 500 mg every 8 hours  - clear liquid diet for now NPO after midnight  - check CBC in am   Hx of C. Difficile: Patient previously on treatment of metronidazole then subsequently switched to oral vancomycin  - complete oral vancomycin course - stool studies pending  Questionable ulcerative colitis flare: As patient has been on C. difficile treatment question - We'll start on IV Solu-Medrol  - follow-up GI recommendations in a.m.    Eleevated bilirubin: Acute mildly elevated total bilirubin of 1.5 - Check total and indirect bilirubin in a.m.   osteopenia -Continue Evista  Code Status:   full Family Communication: bedside Disposition Plan: admit   Total time spent 55 minutes.Greater than 50% of this time was spent in counseling, explanation of diagnosis, planning of further management, and coordination of care  [facility] Pager [phone] If 7PM-7AM, please contact night-coverage [email or web address] Password [email or web address] 02/19/2015, 10:56 PM

## 2015-02-20 DIAGNOSIS — D72829 Elevated white blood cell count, unspecified: Secondary | ICD-10-CM | POA: Diagnosis not present

## 2015-02-20 DIAGNOSIS — A047 Enterocolitis due to Clostridium difficile: Secondary | ICD-10-CM | POA: Diagnosis not present

## 2015-02-20 DIAGNOSIS — K529 Noninfective gastroenteritis and colitis, unspecified: Secondary | ICD-10-CM | POA: Diagnosis not present

## 2015-02-20 DIAGNOSIS — K5190 Ulcerative colitis, unspecified, without complications: Secondary | ICD-10-CM | POA: Diagnosis present

## 2015-02-20 DIAGNOSIS — M8580 Other specified disorders of bone density and structure, unspecified site: Secondary | ICD-10-CM | POA: Diagnosis not present

## 2015-02-20 DIAGNOSIS — R630 Anorexia: Secondary | ICD-10-CM | POA: Diagnosis present

## 2015-02-20 DIAGNOSIS — R112 Nausea with vomiting, unspecified: Secondary | ICD-10-CM

## 2015-02-20 DIAGNOSIS — E876 Hypokalemia: Secondary | ICD-10-CM | POA: Diagnosis not present

## 2015-02-20 DIAGNOSIS — Z8249 Family history of ischemic heart disease and other diseases of the circulatory system: Secondary | ICD-10-CM | POA: Diagnosis not present

## 2015-02-20 DIAGNOSIS — Z803 Family history of malignant neoplasm of breast: Secondary | ICD-10-CM | POA: Diagnosis not present

## 2015-02-20 DIAGNOSIS — R17 Unspecified jaundice: Secondary | ICD-10-CM | POA: Diagnosis not present

## 2015-02-20 DIAGNOSIS — E860 Dehydration: Secondary | ICD-10-CM | POA: Diagnosis not present

## 2015-02-20 DIAGNOSIS — Z800 Family history of malignant neoplasm of digestive organs: Secondary | ICD-10-CM | POA: Diagnosis not present

## 2015-02-20 DIAGNOSIS — A084 Viral intestinal infection, unspecified: Secondary | ICD-10-CM | POA: Diagnosis present

## 2015-02-20 DIAGNOSIS — F419 Anxiety disorder, unspecified: Secondary | ICD-10-CM | POA: Diagnosis present

## 2015-02-20 DIAGNOSIS — Z880 Allergy status to penicillin: Secondary | ICD-10-CM | POA: Diagnosis not present

## 2015-02-20 DIAGNOSIS — Z79899 Other long term (current) drug therapy: Secondary | ICD-10-CM | POA: Diagnosis not present

## 2015-02-20 LAB — BASIC METABOLIC PANEL
Anion gap: 8 (ref 5–15)
BUN: 5 mg/dL — AB (ref 6–20)
CHLORIDE: 109 mmol/L (ref 101–111)
CO2: 25 mmol/L (ref 22–32)
Calcium: 8.9 mg/dL (ref 8.9–10.3)
Creatinine, Ser: 0.66 mg/dL (ref 0.44–1.00)
GFR calc Af Amer: >60 mL/min (ref >60)
GFR calc non Af Amer: >60 mL/min (ref >60)
GLUCOSE: 167 mg/dL — AB (ref 65–99)
POTASSIUM: 3.7 mmol/L (ref 3.5–5.1)
Sodium: 142 mmol/L (ref 135–145)

## 2015-02-20 LAB — CBC
HEMATOCRIT: 42 % (ref 36.0–46.0)
HEMOGLOBIN: 13.6 g/dL (ref 12.0–15.0)
MCH: 29.7 pg (ref 26.0–34.0)
MCHC: 32.4 g/dL (ref 30.0–36.0)
MCV: 91.7 fL (ref 78.0–100.0)
Platelets: 360 10*3/uL (ref 150–400)
RBC: 4.58 MIL/uL (ref 3.87–5.11)
RDW: 14.2 % (ref 11.5–15.5)
WBC: 6.2 10*3/uL (ref 4.0–10.5)

## 2015-02-20 LAB — BILIRUBIN, FRACTIONATED(TOT/DIR/INDIR): Total Bilirubin: 0.9 mg/dL (ref 0.3–1.2)

## 2015-02-20 MED ORDER — MORPHINE SULFATE (PF) 2 MG/ML IV SOLN: 1.0000 mg | INTRAVENOUS | Status: DC | PRN

## 2015-02-20 MED ORDER — RALOXIFENE HCL 60 MG PO TABS
60.0000 mg | ORAL_TABLET | Freq: Every day | ORAL | Status: DC
  Administered 2015-02-20 – 2015-02-22 (×3): 60 mg via ORAL
  Filled 2015-02-20 (×3): qty 1

## 2015-02-20 MED ORDER — ENOXAPARIN SODIUM 40 MG/0.4ML ~~LOC~~ SOLN
40.0000 mg | SUBCUTANEOUS | Status: DC
  Administered 2015-02-21: 40 mg via SUBCUTANEOUS
  Filled 2015-02-20 (×3): qty 0.4

## 2015-02-20 MED ORDER — ONDANSETRON HCL 4 MG PO TABS: 4.0000 mg | ORAL_TABLET | Freq: Three times a day (TID) | ORAL | Status: DC | PRN

## 2015-02-20 MED ORDER — FAMOTIDINE 20 MG PO TABS
20.0000 mg | ORAL_TABLET | Freq: Every day | ORAL | Status: DC
  Administered 2015-02-20 – 2015-02-22 (×3): 20 mg via ORAL
  Filled 2015-02-20 (×3): qty 1

## 2015-02-20 MED ORDER — VITAMIN D 1000 UNITS PO TABS
2000.0000 [IU] | ORAL_TABLET | Freq: Every day | ORAL | Status: DC
  Administered 2015-02-20 – 2015-02-22 (×3): 2000 [IU] via ORAL
  Filled 2015-02-20 (×3): qty 2

## 2015-02-20 MED ORDER — VANCOMYCIN 50 MG/ML ORAL SOLUTION
125.0000 mg | Freq: Four times a day (QID) | ORAL | Status: DC
  Administered 2015-02-20 – 2015-02-22 (×9): 125 mg via ORAL
  Filled 2015-02-20 (×12): qty 2.5

## 2015-02-20 MED ORDER — ONDANSETRON HCL 4 MG/2ML IJ SOLN
4.0000 mg | Freq: Four times a day (QID) | INTRAMUSCULAR | Status: DC | PRN
  Administered 2015-02-21 (×2): 4 mg via INTRAVENOUS
  Filled 2015-02-20 (×2): qty 2

## 2015-02-20 MED ORDER — ACETAMINOPHEN 650 MG RE SUPP: 650.0000 mg | Freq: Four times a day (QID) | RECTAL | Status: DC | PRN

## 2015-02-20 MED ORDER — ESCITALOPRAM OXALATE 10 MG PO TABS
10.0000 mg | ORAL_TABLET | Freq: Every day | ORAL | Status: DC
  Administered 2015-02-20 – 2015-02-22 (×3): 10 mg via ORAL
  Filled 2015-02-20 (×3): qty 1

## 2015-02-20 MED ORDER — METHYLPREDNISOLONE SODIUM SUCC 125 MG IJ SOLR
60.0000 mg | Freq: Every day | INTRAMUSCULAR | Status: DC
  Administered 2015-02-20: 60 mg via INTRAVENOUS
  Filled 2015-02-20: qty 0.96

## 2015-02-20 MED ORDER — HYDROCODONE-ACETAMINOPHEN 5-325 MG PO TABS: 1.0000 | ORAL_TABLET | ORAL | Status: DC | PRN

## 2015-02-20 MED ORDER — HEPARIN SODIUM (PORCINE) 5000 UNIT/ML IJ SOLN
5000.0000 [IU] | Freq: Three times a day (TID) | INTRAMUSCULAR | Status: DC
  Administered 2015-02-20 (×3): 5000 [IU] via SUBCUTANEOUS
  Filled 2015-02-20 (×5): qty 1

## 2015-02-20 MED ORDER — ACETAMINOPHEN 325 MG PO TABS: 650.0000 mg | ORAL_TABLET | Freq: Four times a day (QID) | ORAL | Status: DC | PRN

## 2015-02-20 NOTE — Consult Note (Signed)
Referring Provider:  Dr. Rockne Menghini Primary Care Physician:  Mayra Neer, MD Primary Gastroenterologist:  Dr. Silverio Decamp  Reason for Consultation:  UC and Cdiff  HPI: Erika Fowler is a 80 y.o. female with a past medical history significant for ulcerative colitis, H. pylori, C. difficile who presented to [facility] ED on 1/5 with complaints of abdominal pain, diarrhea, and anorexia.  She has long history of ulcerative colitis, left-sided diagnosed approximately 40 years ago. Her last colonoscopy was done in January 2014 and she had mild chronic active colitis of the left colon. Biopsies were taken from the right colon and were negative. She had a mild flare in March 2016 and responded to a course of budesonide. She stays on Apriso for maintenance, 3 tablets twice daily. She was seen on GI office by myself on 12/8 for complaints of diarrhea and rectal bleeding.  Cdiff ordered but not collected until 12/19 at which time it was positive.  She was initially started on metronidazole orally (started 12/20), but could not tolerate this and therefore was switched over to oral vancomycin (started 12/23). Despite taking medicine as advised she reports continued abdominal pain.  Abdominal pain is described as a sore/irritated/burning feeling. Whenever she tries to eat anything she experiences either diarrhea or nausea and vomiting. Only able to tolerate eating Jell-O, broth, and crackers over this three week period. Unable to quantify how much weight she has lost, but notes that her normal clothes are falling off her at this point.  Upon admission patient was noted to have a fever over 100.79F.  CT scan showed the following:   IMPRESSION: Moderate segment rectosigmoid colonic wall thickening consistent with colitis. Distribution can be seen with ulcerative or C diff colitis. Moderate volume of formed stool in the proximal colon. No bowel obstruction.  GI pathogen panel has been recollected.  Labs look ok.  She has been  maintained on PO vancomycin and IV flagyl was added as well.  Has been started on IV solumedrol 60 mg daily.  Past Medical History  Diagnosis Date  . Colitis, ulcerative ([place])   . Osteopenia   . Uterine prolapse   . Anxiety   . 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 Cellulitis 05/10/14  . H. pylori infection   . C. difficile enteritis     Past Surgical History  Procedure Laterality Date  . Tonsillectomy    . Colonoscopy    . Ulcerative colitis      Prior to Admission medications   Medication Sig Start Date End Date Taking? Authorizing Provider  Cholecalciferol (VITAMIN D) 2000 UNITS tablet Take 2,000 Units by mouth daily.     Yes Historical Provider, MD  escitalopram (LEXAPRO) 10 MG tablet TAKE 1 TABLET DAILY 08/13/14  Yes [name], MD  mesalamine (APRISO) 0.375 G 24 hr capsule Take 3 tablets BID. (6 tablets/day) Patient taking differently: Take 1,125 mg by mouth 2 (two) times daily. Take 3 tablets BID. (6 tablets/day) 12/09/14  Yes Mauri Pole, MD  Multiple Vitamins-Minerals (PRESERVISION/LUTEIN PO) Take 1 tablet by mouth daily.    Yes Historical Provider, MD  ondansetron (ZOFRAN) 4 MG tablet Take 1 tab every 6-8 hours as needed for nausea. 10/27/14  Yes Amy S Esterwood, PA-C  ranitidine (ZANTAC) 150 MG tablet Take 1 tablet (150 mg total) by mouth 2 (two) times daily. 04/22/14  Yes [name], MD  vancomycin (VANCOCIN HCL) 125 MG capsule Take one po QID x 14 days Patient taking differently: Take  125 mg by mouth 4 (four) times daily. Take for 14 days starting 12/23 02/06/15  Yes Jessica D Zehr, PA-C  Na Sulfate-K Sulfate-Mg Sulf SOLN Take 1 kit by mouth as needed. Patient not taking: Reported on 02/19/2015 01/22/15 02/21/15  Laban Emperor Zehr, PA-C  raloxifene (EVISTA) 60 MG tablet Take 60 mg by mouth daily. 12/19/13   Historical Provider, MD    Current Facility-Administered Medications  Medication Dose Route Frequency Provider Last Rate Last Dose  . 0.9 %  sodium  chloride infusion   Intravenous Continuous Lacretia Leigh, MD 125 mL/hr at 02/20/15 0503 125 mL/hr at 02/20/15 0503  . acetaminophen (TYLENOL) tablet 650 mg  650 mg Oral Q6H PRN Norval Morton, MD       Or  . acetaminophen (TYLENOL) suppository 650 mg  650 mg Rectal Q6H PRN Norval Morton, MD      . cholecalciferol (VITAMIN D) tablet 2,000 Units  2,000 Units Oral Daily Rondell A Tamala Julian, MD      . escitalopram (LEXAPRO) tablet 10 mg  10 mg Oral Daily Rondell A Tamala Julian, MD      . famotidine (PEPCID) tablet 20 mg  20 mg Oral Daily Rondell A Tamala Julian, MD      . heparin injection 5,000 Units  5,000 Units Subcutaneous 3 times per day Norval Morton, MD   5,000 Units at 02/20/15 0511  . HYDROcodone-acetaminophen (NORCO/VICODIN) 5-325 MG per tablet 1 tablet  1 tablet Oral Q4H PRN Norval Morton, MD      . methylPREDNISolone sodium succinate (SOLU-MEDROL) 125 mg/2 mL injection 60 mg  60 mg Intravenous Daily Rondell A Tamala Julian, MD      . metroNIDAZOLE (FLAGYL) IVPB 500 mg  500 mg Intravenous Q8H Rondell A Tamala Julian, MD 100 mL/hr at 02/20/15 0510 500 mg at 02/20/15 0510  . morphine 2 MG/ML injection 1 mg  1 mg Intravenous Q2H PRN Norval Morton, MD      . ondansetron (ZOFRAN) injection 4 mg  4 mg Intravenous Q6H PRN Norval Morton, MD      . ondansetron (ZOFRAN) tablet 4 mg  4 mg Oral Q8H PRN Norval Morton, MD      . raloxifene (EVISTA) tablet 60 mg  60 mg Oral Daily Rondell A Tamala Julian, MD      . vancomycin (VANCOCIN) 50 mg/mL oral solution 125 mg  125 mg Oral QID Norval Morton, MD        Allergies as of 02/19/2015 - Review Complete 02/19/2015  Allergen Reaction Noted  . Penicillins  08/09/2007    Family History  Problem Relation Age of Onset  . Colon cancer Mother 39  . Heart disease Mother   . Breast cancer Mother     Age 47's  . Pancreatic cancer Father   . Breast cancer Sister     Age 55's  . Hypertension Sister   . Breast cancer Maternal Aunt     Age 46's  . Breast cancer Paternal Aunt     Age  53's  . Inflammatory bowel disease Sister   . Breast cancer Maternal Aunt     Age 60's  . Stomach cancer Neg Hx     Social History   Social History  . Marital Status: Single    Spouse Name: N/A  . Number of Children: 2  . Years of Education: N/A   Occupational History  . Retired    Social History Main Topics  . Smoking status: Never Smoker   .  Smokeless tobacco: Not on file  . Alcohol Use: No     Comment: wine   . Drug Use: No  . Sexual Activity: Yes    Birth Control/ Protection: Post-menopausal   Other Topics Concern  . Not on file   Social History Narrative   ** Merged History Encounter **        Review of Systems: Ten point ROS is O/W negative except as mentioned in HPI.  Physical Exam: Vital signs in last 24 hours: Temp:  [97.9 F (36.6 C)-98.5 F (36.9 C)] 97.9 F (36.6 C) (01/06 0452) Pulse Rate:  [80-93] 88 (01/06 0452) Resp:  [15-23] 16 (01/06 0452) BP: (111-137)/(69-92) 118/69 mmHg (01/06 0452) SpO2:  [94 %-98 %] 94 % (01/06 0452) Weight:  [125 lb 7.1 oz (56.9 kg)] 125 lb 7.1 oz (56.9 kg) (01/06 0036) Last BM Date: 02/20/15 General:  Alert, Well-developed, well-nourished, pleasant and cooperative in NAD Head:  Normocephalic and atraumatic. Eyes:  Sclera clear, no icterus.  Conjunctiva pink. Ears:  Normal auditory acuity. Mouth:  No deformity or lesions.   Lungs:  Clear throughout to auscultation.  No wheezes, crackles, or rhonchi.  Heart:  Regular rate and rhythm; no murmurs, clicks, rubs, or gallops. Abdomen:  Soft, non-distended.  BS present and hyperactive.  Minimal diffuse TTP.   Rectal:  Deferred  Msk:  Symmetrical without gross deformities. Pulses:  Normal pulses noted. Extremities:  Without clubbing or edema. Neurologic:  Alert and  oriented x4;  grossly normal neurologically. Skin:  Intact without significant lesions or rashes. Psych:  Alert and cooperative. Normal mood and affect.  Intake/Output from previous day: 01/05 0701 - 01/06  0700 In: 870.8 [I.V.:870.8] Out: -   Lab Results:  Recent Labs  02/19/15 1935 02/20/15 0423  WBC 10.6* 6.2  HGB 14.6 13.6  HCT 44.7 42.0  PLT 354 360   BMET  Recent Labs  02/19/15 1935 02/20/15 0423  NA 140 142  K 3.7 3.7  CL 104 109  CO2 26 25  GLUCOSE 100* 167*  BUN 6 5*  CREATININE 0.66 0.66  CALCIUM 8.9 8.9   LFT  Recent Labs  02/19/15 1935  PROT 7.0  ALBUMIN 3.9  AST 22  ALT 18  ALKPHOS 70  BILITOT 1.5*   Studies/Results: Ct Abdomen Pelvis W Contrast  02/19/2015  CLINICAL DATA:  80 year old patient with abdominal pain, diarrhea, and nausea. History of ulcerative colitis and recent diagnosis of C difficile. EXAM: CT ABDOMEN AND PELVIS WITH CONTRAST TECHNIQUE: Multidetector CT imaging of the abdomen and pelvis was performed using the standard protocol following bolus administration of intravenous contrast. CONTRAST:  187m OMNIPAQUE IOHEXOL 300 MG/ML  SOLN COMPARISON:  CT 10/30/2014 FINDINGS: Lower chest: Minimal subsegmental atelectasis at the right lung base and lingula. Heart is normal in size. Liver: Subcentimeter hypodensity adjacent to the gallbladder fossa, unchanged and likely simple cyst. No new hepatic lesion. Hepatobiliary: Gallbladder physiologically distended, no calcified stone. No biliary dilatation. Pancreas: No ductal dilatation or inflammation. Spleen: Lobular contours, normal in size. Adrenal glands: No nodule. Kidneys: Symmetric renal enhancement and excretion. No hydronephrosis. Cortical scarring in the lower left a 1.3 cm simple cyst in the interpolar right kidney is unchanged. Kidney with calcification, unchanged. Stomach/Bowel: Stomach physiologically distended. There are no dilated or thickened small bowel loops. There is colonic wall thickening involving a moderate length segment of distal sigmoid colon with adjacent soft tissue stranding. Question of rectal wall thickening. Moderate volume of formed stool is seen throughout the remainder  the  colon. No additional sites of colonic inflammation. The appendix is normal. Vascular/Lymphatic: No retroperitoneal adenopathy. Abdominal aorta is normal in caliber. Minimal atherosclerosis without aneurysm. Reproductive: Uterus and adnexa normal for age. Borderline prominence of left ovarian vein. Bladder: Physiologically distended, no wall thickening. Other: No free air, free fluid, or intra-abdominal fluid collection. Musculoskeletal: There are no acute or suspicious osseous abnormalities. Multifocal degenerative change throughout the included thoracic and lumbar spine, unchanged from prior. IMPRESSION: Moderate segment rectosigmoid colonic wall thickening consistent with colitis. Distribution can be seen with ulcerative or C diff colitis. Moderate volume of formed stool in the proximal colon. No bowel obstruction. Electronically Signed   By: Jeb Levering M.D.   On: 02/19/2015 21:27   IMPRESSION:  -Long-standing Ulcerative Colitis with superimposed Cdiff.  CT scan shows inflammation of the rectosigmoid colon.  Complaining of abdominal pain, diarrhea, nausea/vomiting, and poor PO intake.   PLAN: -Follow-up results of repeat GI pathogen panel. -Continue PO vancomycin and IV flagyl. -Will D/C IV solumedrol for now in the face of recent/current Cdiff infection. -She is scheduled for colonoscopy with Dr. Silverio Decamp on 1/19.  ? If we should reschedule that.   ZEHR, JESSICA D.  02/20/2015, 9:31 AM  Pager number [phone]  GI ATTENDING  History, laboratories, relevant studies reviewed. Patient personally seen and examined. Husband in room. Agree with extensive consultation note as outlined above. Patient has a history of left-sided colitis. Or recent problems with Clostridium difficile associated diarrhea after being on multiple antibiotics for multiple reasons. Was doing well on vancomycin with resolution of diarrhea. However became more acutely ill with low-grade fever nausea and vomiting. His only had  1 small formed bowel movement just a short while ago. Feeling better today after hydration. I do not think that this is acute Clostridium difficile related disease. She likely has an acute gastroenteritis. Recommend hydration, advance diet as tolerated (I will start full liquids), antiemetics as needed, and complete course of vancomycin as prescribed. I would anticipate that she could be discharged tomorrow if still feeling well and tolerating diet. We're available for questions. Will sign off. Thank you.  Docia Chuck. Geri Seminole., M.D. [facility] Division of Gastroenterology

## 2015-02-20 NOTE — Progress Notes (Signed)
Patient resting in bed.  Pleasant conversation.  Has no complaints of pain.

## 2015-02-20 NOTE — Progress Notes (Signed)
Progress Note   Erika Fowler [id number] DOB: 1935-03-11 DOA: 02/19/2015 PCP: Mayra Neer, MD   Brief Narrative:   Erika Fowler is an 80 y.o. female with a PMH significant for ulcerative colitis, H. pylori, C. difficile who was admitted 02/19/15 with a chief complaint of a 3 week history of abdominal pain, diarrhea, and anorexia. Within the last 2 months she was initially diagnosed and treated for cellulitis, H. pylori, and then C. difficile (tested positive on 12/19).She reports she was initially started on metronidazole orally(started 12/20), but could not tolerate this and therefore was switched over to oral vancomycin(started 12/23). Despite taking medicine as advised she has had continued abdominal pain. Upon admission patient was noted to have a fever over 100.36F. CT scan of the abdomen showed inflammation of the rectosigmoid colon.  Assessment/Plan:   Principal Problem:    Colitis in the setting of a history of ulcerative colitis and recent Clostridium difficile colitis - Continue IV fluids. - Status post 60 mg of IV Solu-Medrol 1. - Continue IV metronidazole and oral vancomycin. - Bowel rest. - Follow-up stool studies (GI pathogen panel). - GI consultation requested.  Active Problems:   Osteopenia - On Evista.    Rectal bleeding - Fecal occult blood testing was negative 02/19/15.    Elevated bilirubin - Fractionate.    Leukocytosis  - Likely reflective of colitis.    DVT Prophylaxis - Lovenox ordered.   Family Communication/Anticipated D/C date and plan/Code Status   Family Communication: No family at the bedside. Disposition Plan: Home when abdominal pain improved. Anticipated D/C date:   1-2 days. Code Status: Full code.   IV Access:    Peripheral IV   Procedures and diagnostic studies:   Ct Abdomen Pelvis W Contrast  02/19/2015  CLINICAL DATA:  80 year old patient with abdominal pain, diarrhea, and nausea. History of ulcerative colitis and  recent diagnosis of C difficile. EXAM: CT ABDOMEN AND PELVIS WITH CONTRAST TECHNIQUE: Multidetector CT imaging of the abdomen and pelvis was performed using the standard protocol following bolus administration of intravenous contrast. CONTRAST:  110mL OMNIPAQUE IOHEXOL 300 MG/ML  SOLN COMPARISON:  CT 10/30/2014 FINDINGS: Lower chest: Minimal subsegmental atelectasis at the right lung base and lingula. Heart is normal in size. Liver: Subcentimeter hypodensity adjacent to the gallbladder fossa, unchanged and likely simple cyst. No new hepatic lesion. Hepatobiliary: Gallbladder physiologically distended, no calcified stone. No biliary dilatation. Pancreas: No ductal dilatation or inflammation. Spleen: Lobular contours, normal in size. Adrenal glands: No nodule. Kidneys: Symmetric renal enhancement and excretion. No hydronephrosis. Cortical scarring in the lower left a 1.3 cm simple cyst in the interpolar right kidney is unchanged. Kidney with calcification, unchanged. Stomach/Bowel: Stomach physiologically distended. There are no dilated or thickened small bowel loops. There is colonic wall thickening involving a moderate length segment of distal sigmoid colon with adjacent soft tissue stranding. Question of rectal wall thickening. Moderate volume of formed stool is seen throughout the remainder the colon. No additional sites of colonic inflammation. The appendix is normal. Vascular/Lymphatic: No retroperitoneal adenopathy. Abdominal aorta is normal in caliber. Minimal atherosclerosis without aneurysm. Reproductive: Uterus and adnexa normal for age. Borderline prominence of left ovarian vein. Bladder: Physiologically distended, no wall thickening. Other: No free air, free fluid, or intra-abdominal fluid collection. Musculoskeletal: There are no acute or suspicious osseous abnormalities. Multifocal degenerative change throughout the included thoracic and lumbar spine, unchanged from prior. IMPRESSION: Moderate segment  rectosigmoid colonic wall thickening consistent with colitis. Distribution can be seen  with ulcerative or C diff colitis. Moderate volume of formed stool in the proximal colon. No bowel obstruction. Electronically Signed   By: Jeb Levering M.D.   On: 02/19/2015 21:27     Medical Consultants:    None.  Anti-Infectives:   Oral vancomycin 02/20/15--->  Flagyl 02/19/15--->  Subjective:    Erika Fowler He had a formed stool this morning.  No melena or hematochezia.  Abdominal pain a bit improved.  Objective:    Filed Vitals:   02/19/15 1913 02/20/15 0011 02/20/15 0036 02/20/15 0452  BP: 111/76 136/92 137/75 118/69  Pulse: 93  80 88  Temp: 98.5 F (36.9 C) 98.5 F (36.9 C) 98 F (36.7 C) 97.9 F (36.6 C)  TempSrc: Oral Oral Oral Oral  Resp: 15 23 18 16   Height:   5' (1.524 m)   Weight:   56.9 kg (125 lb 7.1 oz)   SpO2: 95% 95% 98% 94%    Intake/Output Summary (Last 24 hours) at 02/20/15 0912 Last data filed at 02/20/15 0600  Gross per 24 hour  Intake 870.83 ml  Output      0 ml  Net 870.83 ml   Filed Weights   02/20/15 0036  Weight: 56.9 kg (125 lb 7.1 oz)    Exam: Gen:  NAD Cardiovascular:  RRR, No M/R/G Respiratory:  Lungs CTAB Gastrointestinal:  Abdomen soft, NT/ND, + BS Extremities:  No C/E/C   Data Reviewed:    Labs: Basic Metabolic Panel:  Recent Labs Lab 02/19/15 1935 02/20/15 0423  NA 140 142  K 3.7 3.7  CL 104 109  CO2 26 25  GLUCOSE 100* 167*  BUN 6 5*  CREATININE 0.66 0.66  CALCIUM 8.9 8.9   GFR Estimated Creatinine Clearance: 46.6 mL/min (by C-G formula based on Cr of 0.66). Liver Function Tests:  Recent Labs Lab 02/19/15 1935  AST 22  ALT 18  ALKPHOS 70  BILITOT 1.5*  PROT 7.0  ALBUMIN 3.9   CBC:  Recent Labs Lab 02/19/15 1935 02/20/15 0423  WBC 10.6* 6.2  NEUTROABS 8.9*  --   HGB 14.6 13.6  HCT 44.7 42.0  MCV 91.2 91.7  PLT 354 360    Microbiology No results found for this or any previous visit (from the  past 240 hour(s)).   Medications:   . cholecalciferol  2,000 Units Oral Daily  . escitalopram  10 mg Oral Daily  . famotidine  20 mg Oral Daily  . heparin  5,000 Units Subcutaneous 3 times per day  . methylPREDNISolone (SOLU-MEDROL) injection  60 mg Intravenous Daily  . metronidazole  500 mg Intravenous Q8H  . raloxifene  60 mg Oral Daily  . vancomycin  125 mg Oral QID   Continuous Infusions: . sodium chloride 125 mL/hr (02/20/15 0503)    Time spent: 25 minutes.     [place] [facility] Pager [phone]. If unable to reach me by pager, please call my cell phone at [phone].  *Please refer to [email or web address], password [email or web address] to get updated schedule on who will round on this patient, as hospitalists switch teams weekly. If 7PM-7AM, please contact night-coverage at [email or web address], password [email or web address] for any overnight needs.  02/20/2015, 9:12 AM

## 2015-02-21 DIAGNOSIS — E876 Hypokalemia: Secondary | ICD-10-CM

## 2015-02-21 LAB — CBC
HEMATOCRIT: 39.7 % (ref 36.0–46.0)
HEMOGLOBIN: 12.7 g/dL (ref 12.0–15.0)
MCH: 29.4 pg (ref 26.0–34.0)
MCHC: 32 g/dL (ref 30.0–36.0)
MCV: 91.9 fL (ref 78.0–100.0)
Platelets: 377 10*3/uL (ref 150–400)
RBC: 4.32 MIL/uL (ref 3.87–5.11)
RDW: 14.4 % (ref 11.5–15.5)
WBC: 11.3 10*3/uL — ABNORMAL HIGH (ref 4.0–10.5)

## 2015-02-21 LAB — GASTROINTESTINAL PANEL BY PCR, STOOL (REPLACES STOOL CULTURE)
ASTROVIRUS: NOT DETECTED
Adenovirus F40/41: NOT DETECTED
Campylobacter species: NOT DETECTED
Cryptosporidium: NOT DETECTED
Cyclospora cayetanensis: NOT DETECTED
E. COLI O157: NOT DETECTED
ENTAMOEBA HISTOLYTICA: NOT DETECTED
ENTEROAGGREGATIVE E COLI (EAEC): NOT DETECTED
ENTEROTOXIGENIC E COLI (ETEC): NOT DETECTED
Enteropathogenic E coli (EPEC): NOT DETECTED
Giardia lamblia: NOT DETECTED
NOROVIRUS GI/GII: NOT DETECTED
PLESIMONAS SHIGELLOIDES: NOT DETECTED
Rotavirus A: NOT DETECTED
SAPOVIRUS (I, II, IV, AND V): NOT DETECTED
SHIGA LIKE TOXIN PRODUCING E COLI (STEC): NOT DETECTED
Salmonella species: NOT DETECTED
Shigella/Enteroinvasive E coli (EIEC): NOT DETECTED
VIBRIO CHOLERAE: NOT DETECTED
Vibrio species: NOT DETECTED
Yersinia enterocolitica: NOT DETECTED

## 2015-02-21 LAB — BASIC METABOLIC PANEL
ANION GAP: 8 (ref 5–15)
BUN: 8 mg/dL (ref 6–20)
CHLORIDE: 112 mmol/L — AB (ref 101–111)
CO2: 24 mmol/L (ref 22–32)
Calcium: 9 mg/dL (ref 8.9–10.3)
Creatinine, Ser: 0.73 mg/dL (ref 0.44–1.00)
GFR calc Af Amer: >60 mL/min (ref >60)
GFR calc non Af Amer: >60 mL/min (ref >60)
GLUCOSE: 105 mg/dL — AB (ref 65–99)
POTASSIUM: 3.2 mmol/L — AB (ref 3.5–5.1)
Sodium: 144 mmol/L (ref 135–145)

## 2015-02-21 MED ORDER — POTASSIUM CHLORIDE 20 MEQ/15ML (10%) PO SOLN
20.0000 meq | Freq: Once | ORAL | Status: AC
  Administered 2015-02-21: 20 meq via ORAL
  Filled 2015-02-21: qty 15

## 2015-02-21 MED ORDER — POTASSIUM CHLORIDE CRYS ER 20 MEQ PO TBCR
40.0000 meq | EXTENDED_RELEASE_TABLET | Freq: Once | ORAL | Status: AC
  Administered 2015-02-21: 20 meq via ORAL
  Filled 2015-02-21: qty 2

## 2015-02-21 NOTE — Progress Notes (Signed)
Pt only able to take 43meq in tablet form before stating she couldn't take anymore d/t nausea.  Solution ordered for remaining amt- 37meq.

## 2015-02-21 NOTE — Progress Notes (Signed)
PROGRESS NOTE    Erika Fowler [id number] DOB: 04/03/1935 DOA: 02/19/2015 PCP: Mayra Neer, MD  HPI/Brief narrative 80 y.o. female with a PMH significant for ulcerative colitis, H. pylori, C. difficile who was admitted 02/19/15 with a chief complaint of a 3 week history of abdominal pain, diarrhea, and anorexia. Within the last 2 months she was initially diagnosed and treated for cellulitis, H. pylori, and then C. difficile (tested positive on 12/19).She reports she was initially started on metronidazole orally(started 12/20), but could not tolerate this and therefore was switched over to oral vancomycin(started 12/23). Despite taking medicine as advised she has had continued abdominal pain. Upon admission patient was noted to have a fever over 100.40F. CT scan of the abdomen showed inflammation of the rectosigmoid colon.   Assessment/Plan:  Principal Problem:   Colitis in the setting of a history of ulcerative colitis and recent Clostridium difficile colitis/possible acute viral gastroenteritis - Status post 60 mg of IV Solu-Medrol 1. - Treated with IV metronidazole and home oral vancomycin was continued. - Follow-up stool studies (GI pathogen panel). - Gastroenterology/Dr. Henrene Pastor input from 02/20/15 appreciated: Recent problems with C. difficile associated diarrhea after being on multiple antibiotics for multiple reasons. Was doing well on vancomycin with resolution of diarrhea but became more acutely ill with low-grade fever, nausea and vomiting. He does not think that this is acute C. difficile related disease and feels that this is likely an acute gastroenteritis. He recommended hydration, advancing diet as tolerated and complete vancomycin course as prescribed. - Diarrhea improving. 2 BMs in the last 24 hours-small amount and formed. Issue with nausea-may be related to IV Flagyl which will be discontinued because no clear indication. Advance diet as tolerated.  Active  Problems:  Hypokalemia - Replace and follow   Osteopenia - On Evista.   Rectal bleeding - Fecal occult blood testing was negative 02/19/15.   Elevated bilirubin - Resolved   Leukocytosis  - Likely reflective of colitis. Mild and stable.   DVT prophylaxis: Lovenox Code Status: Full Family Communication: None at bedside Disposition Plan: DC home when medically stable, possibly 1/8.   Consultants:  [facility]  Procedures:  None  Antibiotics:  IV Flagyl 1/5 > 1/6  Oral vancomycin   Subjective: Diarrhea improving. 2 BMs in the last 24 hours-small amount and formed. Issue with nausea  Objective: Filed Vitals:   02/20/15 1410 02/20/15 2134 02/21/15 0600 02/21/15 0909  BP: 139/74 147/80 132/73 124/74  Pulse: 94 88 71 75  Temp: 97.8 F (36.6 C) 97.9 F (36.6 C) 97.8 F (36.6 C) 97.4 F (36.3 C)  TempSrc: Oral Oral Oral Oral  Resp: 16 16 16 16   Height:      Weight:      SpO2: 98% 95% 94% 94%    Intake/Output Summary (Last 24 hours) at 02/21/15 1218 Last data filed at 02/21/15 1117  Gross per 24 hour  Intake   3270 ml  Output      0 ml  Net   3270 ml   Filed Weights   02/20/15 0036  Weight: 56.9 kg (125 lb 7.1 oz)     Exam:  General exam: Pleasant elderly female sitting up comfortably in bed. Respiratory system: Clear. No increased work of breathing. Cardiovascular system: S1 & S2 heard, RRR. No JVD, murmurs, gallops, clicks or pedal edema. Gastrointestinal system: Abdomen is nondistended, soft and nontender. Normal bowel sounds heard. Central nervous system: Alert and oriented. No focal neurological deficits. Extremities: Symmetric 5 x 5  power.   Data Reviewed: Basic Metabolic Panel:  Recent Labs Lab 02/19/15 1935 02/20/15 0423 02/21/15 0507  NA 140 142 144  K 3.7 3.7 3.2*  CL 104 109 112*  CO2 26 25 24   GLUCOSE 100* 167* 105*  BUN 6 5* 8  CREATININE 0.66 0.66 0.73  CALCIUM 8.9 8.9 9.0   Liver Function Tests:  Recent  Labs Lab 02/19/15 1935 02/20/15 0423  AST 22  --   ALT 18  --   ALKPHOS 70  --   BILITOT 1.5* 0.9  PROT 7.0  --   ALBUMIN 3.9  --    No results for input(s): LIPASE, AMYLASE in the last 168 hours. No results for input(s): AMMONIA in the last 168 hours. CBC:  Recent Labs Lab 02/19/15 1935 02/20/15 0423 02/21/15 0507  WBC 10.6* 6.2 11.3*  NEUTROABS 8.9*  --   --   HGB 14.6 13.6 12.7  HCT 44.7 42.0 39.7  MCV 91.2 91.7 91.9  PLT 354 360 377   Cardiac Enzymes: No results for input(s): CKTOTAL, CKMB, CKMBINDEX, TROPONINI in the last 168 hours. BNP (last 3 results) No results for input(s): PROBNP in the last 8760 hours. CBG: No results for input(s): GLUCAP in the last 168 hours.  No results found for this or any previous visit (from the past 240 hour(s)).         Studies: Ct Abdomen Pelvis W Contrast  02/19/2015  CLINICAL DATA:  80 year old patient with abdominal pain, diarrhea, and nausea. History of ulcerative colitis and recent diagnosis of C difficile. EXAM: CT ABDOMEN AND PELVIS WITH CONTRAST TECHNIQUE: Multidetector CT imaging of the abdomen and pelvis was performed using the standard protocol following bolus administration of intravenous contrast. CONTRAST:  17mL OMNIPAQUE IOHEXOL 300 MG/ML  SOLN COMPARISON:  CT 10/30/2014 FINDINGS: Lower chest: Minimal subsegmental atelectasis at the right lung base and lingula. Heart is normal in size. Liver: Subcentimeter hypodensity adjacent to the gallbladder fossa, unchanged and likely simple cyst. No new hepatic lesion. Hepatobiliary: Gallbladder physiologically distended, no calcified stone. No biliary dilatation. Pancreas: No ductal dilatation or inflammation. Spleen: Lobular contours, normal in size. Adrenal glands: No nodule. Kidneys: Symmetric renal enhancement and excretion. No hydronephrosis. Cortical scarring in the lower left a 1.3 cm simple cyst in the interpolar right kidney is unchanged. Kidney with calcification,  unchanged. Stomach/Bowel: Stomach physiologically distended. There are no dilated or thickened small bowel loops. There is colonic wall thickening involving a moderate length segment of distal sigmoid colon with adjacent soft tissue stranding. Question of rectal wall thickening. Moderate volume of formed stool is seen throughout the remainder the colon. No additional sites of colonic inflammation. The appendix is normal. Vascular/Lymphatic: No retroperitoneal adenopathy. Abdominal aorta is normal in caliber. Minimal atherosclerosis without aneurysm. Reproductive: Uterus and adnexa normal for age. Borderline prominence of left ovarian vein. Bladder: Physiologically distended, no wall thickening. Other: No free air, free fluid, or intra-abdominal fluid collection. Musculoskeletal: There are no acute or suspicious osseous abnormalities. Multifocal degenerative change throughout the included thoracic and lumbar spine, unchanged from prior. IMPRESSION: Moderate segment rectosigmoid colonic wall thickening consistent with colitis. Distribution can be seen with ulcerative or C diff colitis. Moderate volume of formed stool in the proximal colon. No bowel obstruction. Electronically Signed   By: Jeb Levering M.D.   On: 02/19/2015 21:27        Scheduled Meds: . cholecalciferol  2,000 Units Oral Daily  . enoxaparin (LOVENOX) injection  40 mg Subcutaneous Q24H  .  escitalopram  10 mg Oral Daily  . famotidine  20 mg Oral Daily  . raloxifene  60 mg Oral Daily  . vancomycin  125 mg Oral QID   Continuous Infusions: . sodium chloride 50 mL/hr at 02/21/15 1117    Principal Problem:   Colitis Active Problems:   Ulcerative colitis ([place])   Osteopenia   Rectal bleeding   Elevated bilirubin   Leukocytosis    Time spent: 20 minutes.    Vernell Leep, MD, FACP, FHM. [facility] Pager [phone]  If 7PM-7AM, please contact night-coverage [email or web address] Password [email or web address] 02/21/2015, 12:18 PM     LOS: 1 day

## 2015-02-22 DIAGNOSIS — R17 Unspecified jaundice: Secondary | ICD-10-CM

## 2015-02-22 DIAGNOSIS — M8580 Other specified disorders of bone density and structure, unspecified site: Secondary | ICD-10-CM

## 2015-02-22 DIAGNOSIS — D72829 Elevated white blood cell count, unspecified: Secondary | ICD-10-CM

## 2015-02-22 DIAGNOSIS — K625 Hemorrhage of anus and rectum: Secondary | ICD-10-CM

## 2015-02-22 LAB — BASIC METABOLIC PANEL
Anion gap: 7 (ref 5–15)
BUN: 7 mg/dL (ref 6–20)
CO2: 27 mmol/L (ref 22–32)
Calcium: 8.7 mg/dL — ABNORMAL LOW (ref 8.9–10.3)
Chloride: 108 mmol/L (ref 101–111)
Creatinine, Ser: 0.64 mg/dL (ref 0.44–1.00)
GFR calc Af Amer: >60 mL/min (ref >60)
GLUCOSE: 95 mg/dL (ref 65–99)
POTASSIUM: 3.7 mmol/L (ref 3.5–5.1)
Sodium: 142 mmol/L (ref 135–145)

## 2015-02-22 NOTE — Discharge Summary (Signed)
Physician Discharge Summary  Erika Fowler [id number] DOB: 1935-02-19 DOA: 02/19/2015  PCP: Mayra Neer, MD  Admit date: 02/19/2015 Discharge date: 02/22/2015  Time spent: Greater than 30 minutes  Recommendations for Outpatient Follow-up:  1. Dr. Mayra Neer, PCP in 5 days 2. Dr. Harl Bowie, [facility] on 03/05/15 at 1:30 PM-keep prior appointment.  Discharge Diagnoses:  Principal Problem:   Colitis Active Problems:   Ulcerative colitis ([place])   Osteopenia   Rectal bleeding   Elevated bilirubin   Leukocytosis   Discharge Condition: Improved & Stable  Diet recommendation: Heart healthy diet.  Filed Weights   02/20/15 0036  Weight: 56.9 kg (125 lb 7.1 oz)    History of present illness:  80 y.o. female with a PMH significant for ulcerative colitis, H. pylori, C. difficile who was admitted 02/19/15 with a chief complaint of a 3 week history of abdominal pain, diarrhea, and anorexia. Within the last 2 months she was initially diagnosed and treated for cellulitis, H. pylori, and then C. difficile (tested positive on 12/19).She reports she was initially started on metronidazole orally(started 12/20), but could not tolerate this and therefore was switched over to oral vancomycin(started 12/23). Despite taking medicine as advised she has had continued abdominal pain. Upon admission patient was noted to have a fever over 100.2F. CT scan of the abdomen showed inflammation of the rectosigmoid colon.  Hospital Course:   Principal Problem:   Colitis in the setting of a history of ulcerative colitis and recent Clostridium difficile colitis/possible acute viral gastroenteritis - Status post 60 mg of IV Solu-Medrol 1. - Treated with IV metronidazole initially and home oral vancomycin was continued. - GI pathogen panel PCR: Negative. - Gastroenterology/Dr. Henrene Pastor input from 02/20/15 appreciated: Recent problems with C. difficile associated diarrhea after being on multiple antibiotics  for multiple reasons. Was doing well on vancomycin with resolution of diarrhea but became more acutely ill with low-grade fever, nausea and vomiting. He does not think that this is acute C. difficile related disease and feels that this is likely an acute gastroenteritis. He recommended hydration, advancing diet as tolerated and complete vancomycin course as prescribed. - Diarrhea resolved. Soft BM overnight. Tolerating advanced diet. No further nausea after Flagyl was discontinued. Denies abdominal pain. As per RN, ambulating comfortably in the room. - DC home and complete course of oral vancomycin that was started as outpatient. Patient has prior follow-up appointment with [facility] on 1/19.  Active Problems:  Hypokalemia - Replaced   Osteopenia - On Evista.   Rectal bleeding - Fecal occult blood testing was negative 02/19/15. No further visible rectal bleeding.   Elevated bilirubin - Resolved   Leukocytosis  - Likely reflective of colitis. Mild and stable.   Consultants:  [facility]  Procedures:  None  Antibiotics:  IV Flagyl 1/5 > 1/6  Oral vancomycin  Discharge Exam:  Complaints: Diarrhea resolved. Soft BM overnight. Tolerating advanced diet. No further nausea after Flagyl was discontinued. Denies abdominal pain. As per RN, ambulating comfortably in the room.  Filed Vitals:   02/21/15 1300 02/21/15 2040 02/22/15 0148 02/22/15 0618  BP: 124/70 151/75 135/74 144/75  Pulse: 67 90 89 71  Temp: 97.9 F (36.6 C) 98.7 F (37.1 C) 98.9 F (37.2 C) 98.7 F (37.1 C)  TempSrc: Oral Oral Oral Oral  Resp: 17 16 16 15   Height:      Weight:      SpO2: 95% 98% 98% 97%    General exam: Pleasant elderly female sitting up  comfortably in bed. Respiratory system: Clear. No increased work of breathing. Cardiovascular system: S1 & S2 heard, RRR. No JVD, murmurs, gallops, clicks or pedal edema. Gastrointestinal system: Abdomen is nondistended, soft and nontender. Normal  bowel sounds heard. Central nervous system: Alert and oriented. No focal neurological deficits. Extremities: Symmetric 5 x 5 power.  Discharge Instructions      Discharge Instructions    Call MD for:  difficulty breathing, headache or visual disturbances    Complete by:  As directed      Call MD for:  extreme fatigue    Complete by:  As directed      Call MD for:  persistant dizziness or light-headedness    Complete by:  As directed      Call MD for:  persistant nausea and vomiting    Complete by:  As directed      Call MD for:  severe uncontrolled pain    Complete by:  As directed      Call MD for:  temperature >100.4    Complete by:  As directed      Call MD for:    Complete by:  As directed   Worsening diarrhea.     Diet - low sodium heart healthy    Complete by:  As directed      Increase activity slowly    Complete by:  As directed             Medication List    STOP taking these medications        Na Sulfate-K Sulfate-Mg Sulf Soln      TAKE these medications        escitalopram 10 MG tablet  Commonly known as:  LEXAPRO  TAKE 1 TABLET DAILY     mesalamine 0.375 g 24 hr capsule  Commonly known as:  APRISO  Take 3 tablets BID. (6 tablets/day)     ondansetron 4 MG tablet  Commonly known as:  ZOFRAN  Take 1 tab every 6-8 hours as needed for nausea.     PRESERVISION/LUTEIN PO  Take 1 tablet by mouth daily.     raloxifene 60 MG tablet  Commonly known as:  EVISTA  Take 60 mg by mouth daily.     ranitidine 150 MG tablet  Commonly known as:  ZANTAC  Take 1 tablet (150 mg total) by mouth 2 (two) times daily.     vancomycin 125 MG capsule  Commonly known as:  VANCOCIN HCL  Take one po QID x 14 days     Vitamin D 2000 units tablet  Take 2,000 Units by mouth daily.       Follow-up Information    Follow up with SHAW,KIMBERLEE, MD. Schedule an appointment as soon as possible for a visit in 5 days.   Specialty:  Family Medicine   Contact information:    [address] [place] [address] [phone]       Follow up with Harl Bowie, MD On 03/05/2015.   Specialty:  Gastroenterology   Why:  Keep prior appointment at 1:30 PM.   Contact information:   [place] [id number] [phone]        The results of significant diagnostics from this hospitalization (including imaging, microbiology, ancillary and laboratory) are listed below for reference.    Significant Diagnostic Studies: Dg Chest 2 View  02/07/2015  CLINICAL DATA:  Central chest pain and burning.  Initial encounter. EXAM: CHEST  2  VIEW COMPARISON:  None. FINDINGS: The lungs are clear. Heart size is normal. No pneumothorax or pleural effusion. Scoliosis noted. IMPRESSION: No acute disease. Electronically Signed   By: Inge Rise M.D.   On: 02/07/2015 12:51   Ct Abdomen Pelvis W Contrast  02/19/2015  CLINICAL DATA:  80 year old patient with abdominal pain, diarrhea, and nausea. History of ulcerative colitis and recent diagnosis of C difficile. EXAM: CT ABDOMEN AND PELVIS WITH CONTRAST TECHNIQUE: Multidetector CT imaging of the abdomen and pelvis was performed using the standard protocol following bolus administration of intravenous contrast. CONTRAST:  155mL OMNIPAQUE IOHEXOL 300 MG/ML  SOLN COMPARISON:  CT 10/30/2014 FINDINGS: Lower chest: Minimal subsegmental atelectasis at the right lung base and lingula. Heart is normal in size. Liver: Subcentimeter hypodensity adjacent to the gallbladder fossa, unchanged and likely simple cyst. No new hepatic lesion. Hepatobiliary: Gallbladder physiologically distended, no calcified stone. No biliary dilatation. Pancreas: No ductal dilatation or inflammation. Spleen: Lobular contours, normal in size. Adrenal glands: No nodule. Kidneys: Symmetric renal enhancement and excretion. No hydronephrosis. Cortical scarring in the lower left a 1.3 cm simple cyst in the interpolar right kidney is unchanged. Kidney  with calcification, unchanged. Stomach/Bowel: Stomach physiologically distended. There are no dilated or thickened small bowel loops. There is colonic wall thickening involving a moderate length segment of distal sigmoid colon with adjacent soft tissue stranding. Question of rectal wall thickening. Moderate volume of formed stool is seen throughout the remainder the colon. No additional sites of colonic inflammation. The appendix is normal. Vascular/Lymphatic: No retroperitoneal adenopathy. Abdominal aorta is normal in caliber. Minimal atherosclerosis without aneurysm. Reproductive: Uterus and adnexa normal for age. Borderline prominence of left ovarian vein. Bladder: Physiologically distended, no wall thickening. Other: No free air, free fluid, or intra-abdominal fluid collection. Musculoskeletal: There are no acute or suspicious osseous abnormalities. Multifocal degenerative change throughout the included thoracic and lumbar spine, unchanged from prior. IMPRESSION: Moderate segment rectosigmoid colonic wall thickening consistent with colitis. Distribution can be seen with ulcerative or C diff colitis. Moderate volume of formed stool in the proximal colon. No bowel obstruction. Electronically Signed   By: Jeb Levering M.D.   On: 02/19/2015 21:27    Microbiology: Recent Results (from the past 240 hour(s))  Gastrointestinal Panel by PCR , Stool     Status: None   Collection Time: 02/20/15 12:50 PM  Result Value Ref Range Status   Campylobacter species NOT DETECTED NOT DETECTED Final   Plesimonas shigelloides NOT DETECTED NOT DETECTED Final   Salmonella species NOT DETECTED NOT DETECTED Final   Yersinia enterocolitica NOT DETECTED NOT DETECTED Final   Vibrio species NOT DETECTED NOT DETECTED Final   Vibrio cholerae NOT DETECTED NOT DETECTED Final   Enteroaggregative E coli (EAEC) NOT DETECTED NOT DETECTED Final   Enteropathogenic E coli (EPEC) NOT DETECTED NOT DETECTED Final   Enterotoxigenic E  coli (ETEC) NOT DETECTED NOT DETECTED Final   Shiga like toxin producing E coli (STEC) NOT DETECTED NOT DETECTED Final   E. coli O157 NOT DETECTED NOT DETECTED Final   Shigella/Enteroinvasive E coli (EIEC) NOT DETECTED NOT DETECTED Final   Cryptosporidium NOT DETECTED NOT DETECTED Final   Cyclospora cayetanensis NOT DETECTED NOT DETECTED Final   Entamoeba histolytica NOT DETECTED NOT DETECTED Final   Giardia lamblia NOT DETECTED NOT DETECTED Final   Adenovirus F40/41 NOT DETECTED NOT DETECTED Final   Astrovirus NOT DETECTED NOT DETECTED Final   Norovirus GI/GII NOT DETECTED NOT DETECTED Final   Rotavirus A NOT DETECTED NOT  DETECTED Final   Sapovirus (I, II, IV, and V) NOT DETECTED NOT DETECTED Final     Labs: Basic Metabolic Panel:  Recent Labs Lab 02/19/15 1935 02/20/15 0423 02/21/15 0507 02/22/15 0445  NA 140 142 144 142  K 3.7 3.7 3.2* 3.7  CL 104 109 112* 108  CO2 26 25 24 27   GLUCOSE 100* 167* 105* 95  BUN 6 5* 8 7  CREATININE 0.66 0.66 0.73 0.64  CALCIUM 8.9 8.9 9.0 8.7*   Liver Function Tests:  Recent Labs Lab 02/19/15 1935 02/20/15 0423  AST 22  --   ALT 18  --   ALKPHOS 70  --   BILITOT 1.5* 0.9  PROT 7.0  --   ALBUMIN 3.9  --    No results for input(s): LIPASE, AMYLASE in the last 168 hours. No results for input(s): AMMONIA in the last 168 hours. CBC:  Recent Labs Lab 02/19/15 1935 02/20/15 0423 02/21/15 0507  WBC 10.6* 6.2 11.3*  NEUTROABS 8.9*  --   --   HGB 14.6 13.6 12.7  HCT 44.7 42.0 39.7  MCV 91.2 91.7 91.9  PLT 354 360 377   Cardiac Enzymes: No results for input(s): CKTOTAL, CKMB, CKMBINDEX, TROPONINI in the last 168 hours. BNP: BNP (last 3 results) No results for input(s): BNP in the last 8760 hours.  ProBNP (last 3 results) No results for input(s): PROBNP in the last 8760 hours.  CBG: No results for input(s): GLUCAP in the last 168 hours.    Signed:  Vernell Leep, MD, FACP, FHM. [facility] Pager  [phone]  If 7PM-7AM, please contact night-coverage [email or web address] Password [email or web address] 02/22/2015, 1:44 PM

## 2015-02-22 NOTE — Discharge Instructions (Signed)

## 2015-02-24 ENCOUNTER — Other Ambulatory Visit: Payer: Self-pay | Admitting: Physician Assistant

## 2015-03-05 ENCOUNTER — Encounter: Payer: Medicare Other | Admitting: Gastroenterology

## 2015-05-19 ENCOUNTER — Other Ambulatory Visit: Payer: Self-pay | Admitting: Gastroenterology

## 2015-11-16 ENCOUNTER — Other Ambulatory Visit: Payer: Self-pay | Admitting: Family Medicine

## 2015-11-16 DIAGNOSIS — Z1231 Encounter for screening mammogram for malignant neoplasm of breast: Secondary | ICD-10-CM

## 2015-12-04 ENCOUNTER — Other Ambulatory Visit: Payer: Self-pay | Admitting: Family Medicine

## 2015-12-04 DIAGNOSIS — M8580 Other specified disorders of bone density and structure, unspecified site: Secondary | ICD-10-CM

## 2015-12-09 ENCOUNTER — Ambulatory Visit: Payer: Self-pay

## 2015-12-11 ENCOUNTER — Emergency Department (HOSPITAL_COMMUNITY)
Admission: EM | Admit: 2015-12-11 | Discharge: 2015-12-11 | Disposition: A | Discharge status: Home / Self Care | Payer: Medicare Other | Attending: Emergency Medicine | Admitting: Emergency Medicine

## 2015-12-11 ENCOUNTER — Encounter (HOSPITAL_COMMUNITY): Payer: Self-pay | Admitting: Emergency Medicine

## 2015-12-11 DIAGNOSIS — K625 Hemorrhage of anus and rectum: Secondary | ICD-10-CM | POA: Diagnosis not present

## 2015-12-11 DIAGNOSIS — K51911 Ulcerative colitis, unspecified with rectal bleeding: Secondary | ICD-10-CM | POA: Insufficient documentation

## 2015-12-11 DIAGNOSIS — Z79899 Other long term (current) drug therapy: Secondary | ICD-10-CM | POA: Diagnosis not present

## 2015-12-11 DIAGNOSIS — R110 Nausea: Secondary | ICD-10-CM | POA: Diagnosis not present

## 2015-12-11 DIAGNOSIS — K51919 Ulcerative colitis, unspecified with unspecified complications: Secondary | ICD-10-CM

## 2015-12-11 LAB — COMPREHENSIVE METABOLIC PANEL
ALBUMIN: 3.9 g/dL (ref 3.5–5.0)
ALK PHOS: 83 U/L (ref 38–126)
ALT: 29 U/L (ref 14–54)
ANION GAP: 9 (ref 5–15)
AST: 26 U/L (ref 15–41)
BILIRUBIN TOTAL: 0.9 mg/dL (ref 0.3–1.2)
BUN: 17 mg/dL (ref 6–20)
CALCIUM: 9.4 mg/dL (ref 8.9–10.3)
CO2: 28 mmol/L (ref 22–32)
Chloride: 102 mmol/L (ref 101–111)
Creatinine, Ser: 0.65 mg/dL (ref 0.44–1.00)
Glucose, Bld: 119 mg/dL — ABNORMAL HIGH (ref 65–99)
POTASSIUM: 3.6 mmol/L (ref 3.5–5.1)
Sodium: 139 mmol/L (ref 135–145)
TOTAL PROTEIN: 7.4 g/dL (ref 6.5–8.1)

## 2015-12-11 LAB — TYPE AND SCREEN
ABO/RH(D): A POS
Antibody Screen: NEGATIVE

## 2015-12-11 LAB — CBC
HCT: 44.6 % (ref 36.0–46.0)
Hemoglobin: 15.1 g/dL — ABNORMAL HIGH (ref 12.0–15.0)
MCH: 30 pg (ref 26.0–34.0)
MCHC: 33.9 g/dL (ref 30.0–36.0)
MCV: 88.5 fL (ref 78.0–100.0)
PLATELETS: 334 10*3/uL (ref 150–400)
RBC: 5.04 MIL/uL (ref 3.87–5.11)
RDW: 13.8 % (ref 11.5–15.5)
WBC: 8.4 10*3/uL (ref 4.0–10.5)

## 2015-12-11 LAB — POC OCCULT BLOOD, ED: Fecal Occult Bld: POSITIVE — AB

## 2015-12-11 LAB — ABO/RH: ABO/RH(D): A POS

## 2015-12-11 MED ORDER — PREDNISONE 10 MG PO TABS: 10.0000 mg | ORAL_TABLET | Freq: Every day | ORAL | 0 refills | Status: DC

## 2015-12-11 MED ORDER — SODIUM CHLORIDE 0.9 % IV BOLUS (SEPSIS)
1000.0000 mL | Freq: Once | INTRAVENOUS | Status: AC
  Administered 2015-12-11: 1000 mL via INTRAVENOUS

## 2015-12-11 MED ORDER — ONDANSETRON HCL 4 MG PO TABS: 4.0000 mg | ORAL_TABLET | Freq: Four times a day (QID) | ORAL | 0 refills | Status: AC

## 2015-12-11 NOTE — ED Provider Notes (Signed)
[facility] DEPT Provider Note   CSN: [id number] Arrival date & time: 12/11/15  1126     History   Chief Complaint Chief Complaint  Patient presents with  . Rectal Bleeding    HPI Erika Fowler is a 80 y.o. female.  80 year old Caucasian female past medical history significant for ulcerative colitis, history of C. difficile, H. pylori infection presents to the ED today with rectal bleeding. Patient states that she has been having rectal bleeding x 3-4 days. She describes it as bright red beds at times with dark clots. Patient also states that she has been having some abdominal burning that is relieved with a bowel movement. Describes the pain as a "soreness". The pain is intermittent. Nothing makes better or worse. Patient states that her appetite has been poor for the past week. States that she has only had liquid intake. She reports nausea but no vomiting. Patient's last endoscopy was in December 2016. At that time patient was diagnosed with C. difficile after having antibiotic course for cellulitis. Patient states that she has had complaints of diarrhea and rectal bleeding in the past. Patient called her GI doctor which is Dr. Amedeo Plenty for [facility]. She dropped off a stool sample yesterday to test for C. Difficile and had a bmp performed. Patient states she called back today and they instructed patient to come to the ED. Patient denies any NSAID use. She denies any epigastric pain, early satiety, belching. Patient states that she recently had a colonoscopy performed by Dr. Amedeo Plenty that has several polyps removed a few months ago. Symptoms are associated with weakness. Patient states is related to her not eating for the past few days. Patient also endorses mild sore throat and rhinorrhea for the past few days. She denies any fever, chills, headache, vision changes, lightheadedness, dizziness, chest pain, shortness of breath emesis, urinary symptoms, constipation , diarrhea.        Past  Medical History:  Diagnosis Date  . Anxiety   . C. difficile enteritis   . Cellulitis 05/10/14  . Colitis, ulcerative ([place])   . H. pylori infection   . Osteopenia   . 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 Uterine prolapse     Patient Active Problem List   Diagnosis Date Noted  . Colitis 02/19/2015  . Elevated bilirubin 02/19/2015  . Leukocytosis 02/19/2015  . Rectal bleeding 02/03/2015  . Helicobacter pylori (H. pylori) infection 02/03/2015  . Osteopenia   . Uterine prolapse   . ANXIETY 08/09/2007  . Ulcerative colitis ([place]) 08/09/2007    Past Surgical History:  Procedure Laterality Date  . COLONOSCOPY    . TONSILLECTOMY    . ulcerative colitis      OB History    Gravida Para Term Preterm AB Living   2 2 2  0 0     SAB TAB Ectopic Multiple Live Births   0 0 0           Home Medications    Prior to Admission medications   Medication Sig Start Date End Date Taking? Authorizing Provider  Cholecalciferol (VITAMIN D) 2000 UNITS tablet Take 2,000 Units by mouth daily.      Historical Provider, MD  escitalopram (LEXAPRO) 10 MG tablet TAKE 1 TABLET DAILY 08/13/14   [name], MD  mesalamine (APRISO) 0.375 G 24 hr capsule Take 3 tablets BID. (6 tablets/day) Patient taking differently: Take 1,125 mg by mouth 2 (two) times daily. Take 3 tablets BID. (6 tablets/day) 12/09/14  Mauri Pole, MD  Multiple Vitamins-Minerals (PRESERVISION/LUTEIN PO) Take 1 tablet by mouth daily.     Historical Provider, MD  ondansetron (ZOFRAN) 4 MG tablet Take 1 tablet (4 mg total) by mouth every 8 (eight) hours as needed for nausea or vomiting. 02/24/15   Mauri Pole, MD  raloxifene (EVISTA) 60 MG tablet Take 60 mg by mouth daily. 12/19/13   Historical Provider, MD  ranitidine (ZANTAC) 150 MG tablet Take 1 tablet (150 mg total) by mouth 2 (two) times daily. 04/22/14   [name], MD  vancomycin (VANCOCIN HCL) 125 MG capsule Take one po QID x 14 days Patient taking  differently: Take 125 mg by mouth 4 (four) times daily. Take for 14 days starting 12/23 02/06/15   Loralie Champagne, PA-C    Family History Family History  Problem Relation Age of Onset  . Colon cancer Mother 7  . Heart disease Mother   . Breast cancer Mother     Age 39's  . Pancreatic cancer Father   . Breast cancer Sister     Age 51's  . Hypertension Sister   . Breast cancer Maternal Aunt     Age 71's  . Breast cancer Paternal Aunt     Age 32's  . Inflammatory bowel disease Sister   . Breast cancer Maternal Aunt     Age 21's  . Stomach cancer Neg Hx     Social History Social History  Substance Use Topics  . Smoking status: Never Smoker  . Smokeless tobacco: Never Used  . Alcohol use No     Comment: wine      Allergies   Penicillins   Review of Systems Review of Systems  Constitutional: Positive for appetite change. Negative for activity change, chills, fatigue and fever.  HENT: Positive for congestion, rhinorrhea and sore throat.   Eyes: Negative.   Respiratory: Negative for cough and shortness of breath.   Cardiovascular: Negative for chest pain and palpitations.  Gastrointestinal: Positive for abdominal pain, blood in stool and nausea. Negative for constipation, diarrhea and vomiting.  Genitourinary: Negative.   Musculoskeletal: Negative.   Skin: Negative.   Neurological: Negative for dizziness, syncope, weakness, light-headedness and headaches.  All other systems reviewed and are negative.    Physical Exam Updated Vital Signs BP 117/78 (BP Location: Right Arm)   Pulse 94   Temp 97.6 F (36.4 C) (Oral)   Resp 18   SpO2 95%   Physical Exam  Constitutional: She is oriented to person, place, and time. She appears well-developed and well-nourished. No distress.  HENT:  Head: Normocephalic and atraumatic.  Mouth/Throat: Oropharynx is clear and moist.  Eyes: Conjunctivae are normal. Pupils are equal, round, and reactive to light. Right eye exhibits no  discharge. Left eye exhibits no discharge. No scleral icterus.  Neck: Normal range of motion. Neck supple. No thyromegaly present.  Cardiovascular: Regular rhythm, normal heart sounds and intact distal pulses.  Tachycardia present.  Exam reveals no gallop and no friction rub.   No murmur heard. Pulses:      Radial pulses are 2+ on the right side, and 2+ on the left side.       Dorsalis pedis pulses are 2+ on the right side, and 2+ on the left side.  Patient was initially noted to be tachycardic however she has she is not regular rate and rhythm.  Pulmonary/Chest: Effort normal and breath sounds normal. No respiratory distress.  Abdominal: Soft. Bowel sounds are normal.  She exhibits no distension. There is no tenderness. There is no rebound and no guarding.  Patient is not tender to palpation on exam. Abdomen is not distended. No signs of peritonitis. No rebound or guarding.  Genitourinary: Rectal exam shows external hemorrhoid and guaiac positive stool. Rectal exam shows no internal hemorrhoid, no tenderness and anal tone normal.  Genitourinary Comments: Chaperone present for exam. Pink tinged liquid noted in rectal vault. Normal rectal tone. Nontender wrist external hemorrhoids noted. No internal hemorrhoids appreciated. Patient tolerated exam well. Hemoccult positive  Musculoskeletal: Normal range of motion.  Lymphadenopathy:    She has no cervical adenopathy.  Neurological: She is alert and oriented to person, place, and time.  Skin: Skin is warm and dry. Capillary refill takes less than 2 seconds.  Nursing note and vitals reviewed.    ED Treatments / Results  Labs (all labs ordered are listed, but only abnormal results are displayed) Labs Reviewed  COMPREHENSIVE METABOLIC PANEL - Abnormal; Notable for the following:       Result Value   Glucose, Bld 119 (*)    All other components within normal limits  CBC - Abnormal; Notable for the following:    Hemoglobin 15.1 (*)    All other  components within normal limits  POC OCCULT BLOOD, ED - Abnormal; Notable for the following:    Fecal Occult Bld POSITIVE (*)    All other components within normal limits  TYPE AND SCREEN  ABO/RH    EKG  EKG Interpretation  Date/Time:  Friday December 11 2015 16:13:51 EDT Ventricular Rate:  99 PR Interval:    QRS Duration: 134 QT Interval:  377 QTC Calculation: 484 R Axis:   -14 Text Interpretation:  Sinus rhythm Right bundle branch block No old tracing to compare Baseline wander in lead(s) V3 V4 Confirmed by Winfred Leeds  MD, SAM [phone]) on 12/11/2015 4:22:19 PM       Radiology No results found.  Procedures Procedures (including critical care time)  Medications Ordered in ED Medications  sodium chloride 0.9 % bolus 1,000 mL (0 mLs Intravenous Stopped 12/11/15 1800)     Initial Impression / Assessment and Plan / ED Course  I have reviewed the triage vital signs and the nursing notes.  Pertinent labs & imaging results that were available during my care of the patient were reviewed by me and considered in my medical decision making (see chart for details).  Clinical Course  Patient with history of ulcerative colitis presents to the ED today with rectal bleeding. Hemoglobin was 15.1 in ED. She was initially to be tachycardic after fluid heart rate improved to 96. So with Dr. Amedeo Plenty with [facility] states this is likely ulcerative colitis flare. Rectal bleeding more consistent with UC and no c diff. Patient denies history of diarrhea, or recent abx use. States she has a C. difficile result pending in the office. He recommended starting patient on prednisone taper and have her follow up in office in 1- 2 weeks. Dr. Ernst Bowler spoke with patient on the phone and she is agreeable to plan. Her hemoglobin is stable this time. Tachycardia likely due to dehydration. Patient feels much improved after fluids. Abdomen was without acute changes. No signs of peritonitis no imaging indicated at this  time. Patient was seen an evaluated by Dr. Winfred Leeds who is agreeable to the above plan. Patient is hemodynamically stable and ready for discharge. Strict return precautions given. Patient encouraged to advance diet as tolerated and take zofran as needed for  nausea.   Final Clinical Impressions(s) / ED Diagnoses   Final diagnoses:  Rectal bleeding  Ulcerative colitis with complication, unspecified location (HCC)  Nausea    New Prescriptions New Prescriptions   ONDANSETRON (ZOFRAN) 4 MG TABLET    Take 1 tablet (4 mg total) by mouth every 6 (six) hours.   PREDNISONE (DELTASONE) 10 MG TABLET    Take 1 tablet (10 mg total) by mouth daily with breakfast. Take 4 tablets for 3 days. Then take 3 tablets for 4 days. Then take 2 tablets for 5 days. Then take 1 tablet for 7 days.     Doristine Devoid, PA-C 12/11/15 [place], MD 12/11/15 [phone]

## 2015-12-11 NOTE — Discharge Instructions (Signed)
Please take the steroids as prescribed. Follow-up with Dr. Amedeo Plenty in the next 1-2 weeks. Dr. Amedeo Plenty says that he will follow up with you concerning your C difficile results. Please take the Zofran as needed for nausea. Please return to the ED if your symptoms worsen.

## 2015-12-11 NOTE — ED Notes (Signed)
Pt stated she has a one bowel movement while waiting for acute room with blood in it.

## 2015-12-11 NOTE — ED Triage Notes (Addendum)
Patient reports black stool since Tuesday.  Reports nausea.  Denies vomiting but states unable to eat, weakness.  Reports abdominal pain after having bowel movement

## 2015-12-11 NOTE — ED Provider Notes (Signed)
Complains of blood-tinged diarrhea multiple episodes per day onset 3 days ago. She denies lightheadedness denies abdominal pain denies fever. No other associated symptoms. On exam alert no distress lungs clear auscultation heart regular rate and rhythm abdomen nondistended nontender normal active bowel sounds extremities without edema   Erika Dakin, MD 12/11/15 [phone]

## 2015-12-23 ENCOUNTER — Ambulatory Visit
Admission: RE | Admit: 2015-12-23 | Discharge: 2015-12-23 | Disposition: A | Discharge status: Home / Self Care | Payer: Medicare Other | Source: Ambulatory Visit | Attending: Family Medicine | Admitting: Family Medicine

## 2015-12-23 DIAGNOSIS — Z1231 Encounter for screening mammogram for malignant neoplasm of breast: Secondary | ICD-10-CM

## 2015-12-23 DIAGNOSIS — M8580 Other specified disorders of bone density and structure, unspecified site: Secondary | ICD-10-CM

## 2016-03-28 DIAGNOSIS — K5190 Ulcerative colitis, unspecified, without complications: Secondary | ICD-10-CM | POA: Diagnosis not present

## 2016-05-03 DIAGNOSIS — H43813 Vitreous degeneration, bilateral: Secondary | ICD-10-CM | POA: Diagnosis not present

## 2016-05-03 DIAGNOSIS — H3582 Retinal ischemia: Secondary | ICD-10-CM | POA: Diagnosis not present

## 2016-05-03 DIAGNOSIS — H34811 Central retinal vein occlusion, right eye, with macular edema: Secondary | ICD-10-CM | POA: Diagnosis not present

## 2016-06-08 DIAGNOSIS — K5190 Ulcerative colitis, unspecified, without complications: Secondary | ICD-10-CM | POA: Diagnosis not present

## 2016-06-08 DIAGNOSIS — E7800 Pure hypercholesterolemia, unspecified: Secondary | ICD-10-CM | POA: Diagnosis not present

## 2016-06-08 DIAGNOSIS — R69 Illness, unspecified: Secondary | ICD-10-CM | POA: Diagnosis not present

## 2016-06-15 DIAGNOSIS — K5190 Ulcerative colitis, unspecified, without complications: Secondary | ICD-10-CM | POA: Diagnosis not present

## 2016-08-02 DIAGNOSIS — H35372 Puckering of macula, left eye: Secondary | ICD-10-CM | POA: Diagnosis not present

## 2016-08-02 DIAGNOSIS — H43813 Vitreous degeneration, bilateral: Secondary | ICD-10-CM | POA: Diagnosis not present

## 2016-08-02 DIAGNOSIS — H3582 Retinal ischemia: Secondary | ICD-10-CM | POA: Diagnosis not present

## 2016-08-02 DIAGNOSIS — H34811 Central retinal vein occlusion, right eye, with macular edema: Secondary | ICD-10-CM | POA: Diagnosis not present

## 2016-09-14 DIAGNOSIS — K5190 Ulcerative colitis, unspecified, without complications: Secondary | ICD-10-CM | POA: Diagnosis not present

## 2016-09-15 DIAGNOSIS — Z961 Presence of intraocular lens: Secondary | ICD-10-CM | POA: Diagnosis not present

## 2016-10-12 DIAGNOSIS — R69 Illness, unspecified: Secondary | ICD-10-CM | POA: Diagnosis not present

## 2016-10-27 DIAGNOSIS — K5190 Ulcerative colitis, unspecified, without complications: Secondary | ICD-10-CM | POA: Diagnosis not present

## 2016-10-27 DIAGNOSIS — R1031 Right lower quadrant pain: Secondary | ICD-10-CM | POA: Diagnosis not present

## 2016-10-27 DIAGNOSIS — R197 Diarrhea, unspecified: Secondary | ICD-10-CM | POA: Diagnosis not present

## 2016-10-28 ENCOUNTER — Inpatient Hospital Stay (HOSPITAL_BASED_OUTPATIENT_CLINIC_OR_DEPARTMENT_OTHER)
Admission: EM | Admit: 2016-10-28 | Discharge: 2016-10-30 | DRG: 342 | Disposition: A | Discharge status: Home / Self Care | Payer: MEDICARE | Attending: General Surgery | Admitting: General Surgery

## 2016-10-28 ENCOUNTER — Observation Stay (HOSPITAL_COMMUNITY): Payer: MEDICARE | Admitting: Certified Registered Nurse Anesthetist

## 2016-10-28 ENCOUNTER — Encounter (HOSPITAL_COMMUNITY): Admission: EM | Disposition: A | Discharge status: Home / Self Care | Payer: Self-pay | Source: Home / Self Care

## 2016-10-28 ENCOUNTER — Encounter (HOSPITAL_BASED_OUTPATIENT_CLINIC_OR_DEPARTMENT_OTHER): Payer: Self-pay | Admitting: *Deleted

## 2016-10-28 ENCOUNTER — Inpatient Hospital Stay: Admit: 2016-10-28 | Payer: Medicare HMO | Admitting: Surgery

## 2016-10-28 ENCOUNTER — Emergency Department (HOSPITAL_BASED_OUTPATIENT_CLINIC_OR_DEPARTMENT_OTHER): Payer: MEDICARE

## 2016-10-28 DIAGNOSIS — N814 Uterovaginal prolapse, unspecified: Secondary | ICD-10-CM | POA: Diagnosis present

## 2016-10-28 DIAGNOSIS — K5190 Ulcerative colitis, unspecified, without complications: Secondary | ICD-10-CM | POA: Diagnosis present

## 2016-10-28 DIAGNOSIS — M8580 Other specified disorders of bone density and structure, unspecified site: Secondary | ICD-10-CM | POA: Diagnosis not present

## 2016-10-28 DIAGNOSIS — Z79899 Other long term (current) drug therapy: Secondary | ICD-10-CM

## 2016-10-28 DIAGNOSIS — Z800 Family history of malignant neoplasm of digestive organs: Secondary | ICD-10-CM | POA: Diagnosis not present

## 2016-10-28 DIAGNOSIS — K3580 Unspecified acute appendicitis: Principal | ICD-10-CM | POA: Diagnosis present

## 2016-10-28 DIAGNOSIS — Z803 Family history of malignant neoplasm of breast: Secondary | ICD-10-CM | POA: Diagnosis not present

## 2016-10-28 DIAGNOSIS — K51911 Ulcerative colitis, unspecified with rectal bleeding: Secondary | ICD-10-CM | POA: Diagnosis not present

## 2016-10-28 DIAGNOSIS — Z8249 Family history of ischemic heart disease and other diseases of the circulatory system: Secondary | ICD-10-CM

## 2016-10-28 DIAGNOSIS — R109 Unspecified abdominal pain: Secondary | ICD-10-CM | POA: Diagnosis not present

## 2016-10-28 DIAGNOSIS — R69 Illness, unspecified: Secondary | ICD-10-CM | POA: Diagnosis not present

## 2016-10-28 DIAGNOSIS — F419 Anxiety disorder, unspecified: Secondary | ICD-10-CM | POA: Diagnosis not present

## 2016-10-28 DIAGNOSIS — H349 Unspecified retinal vascular occlusion: Secondary | ICD-10-CM | POA: Diagnosis not present

## 2016-10-28 DIAGNOSIS — Z880 Allergy status to penicillin: Secondary | ICD-10-CM | POA: Diagnosis not present

## 2016-10-28 DIAGNOSIS — R1031 Right lower quadrant pain: Secondary | ICD-10-CM | POA: Diagnosis present

## 2016-10-28 HISTORY — PX: LAPAROSCOPIC APPENDECTOMY: SHX408

## 2016-10-28 LAB — URINALYSIS, ROUTINE W REFLEX MICROSCOPIC
Glucose, UA: NEGATIVE mg/dL
KETONES UR: 15 mg/dL — AB
NITRITE: POSITIVE — AB
PROTEIN: 30 mg/dL — AB
Specific Gravity, Urine: 1.025 (ref 1.005–1.030)
pH: 6 (ref 5.0–8.0)

## 2016-10-28 LAB — COMPREHENSIVE METABOLIC PANEL
ALT: 15 U/L (ref 14–54)
ANION GAP: 7 (ref 5–15)
AST: 21 U/L (ref 15–41)
Albumin: 3.7 g/dL (ref 3.5–5.0)
Alkaline Phosphatase: 68 U/L (ref 38–126)
BILIRUBIN TOTAL: 1.4 mg/dL — AB (ref 0.3–1.2)
BUN: 14 mg/dL (ref 6–20)
CALCIUM: 9.5 mg/dL (ref 8.9–10.3)
CO2: 29 mmol/L (ref 22–32)
Chloride: 103 mmol/L (ref 101–111)
Creatinine, Ser: 0.68 mg/dL (ref 0.44–1.00)
GFR calc non Af Amer: >60 mL/min (ref >60)
Glucose, Bld: 106 mg/dL — ABNORMAL HIGH (ref 65–99)
Potassium: 3.9 mmol/L (ref 3.5–5.1)
SODIUM: 139 mmol/L (ref 135–145)
TOTAL PROTEIN: 7 g/dL (ref 6.5–8.1)

## 2016-10-28 LAB — CBC
HCT: 41.5 % (ref 36.0–46.0)
Hemoglobin: 14 g/dL (ref 12.0–15.0)
MCH: 30.7 pg (ref 26.0–34.0)
MCHC: 33.7 g/dL (ref 30.0–36.0)
MCV: 91 fL (ref 78.0–100.0)
Platelets: 332 10*3/uL (ref 150–400)
RBC: 4.56 MIL/uL (ref 3.87–5.11)
RDW: 14.3 % (ref 11.5–15.5)
WBC: 9.8 10*3/uL (ref 4.0–10.5)

## 2016-10-28 LAB — URINALYSIS, MICROSCOPIC (REFLEX)

## 2016-10-28 SURGERY — APPENDECTOMY, LAPAROSCOPIC
Anesthesia: General | Site: Abdomen

## 2016-10-28 MED ORDER — FENTANYL CITRATE (PF) 100 MCG/2ML IJ SOLN
INTRAMUSCULAR | Status: AC
  Filled 2016-10-28: qty 2

## 2016-10-28 MED ORDER — ROCURONIUM BROMIDE 50 MG/5ML IV SOSY
PREFILLED_SYRINGE | INTRAVENOUS | Status: AC
  Filled 2016-10-28: qty 5

## 2016-10-28 MED ORDER — SODIUM CHLORIDE 0.9 % IV BOLUS (SEPSIS)
1000.0000 mL | Freq: Once | INTRAVENOUS | Status: AC
Start: 2016-10-28 — End: 2016-10-28
  Administered 2016-10-28: 1000 mL via INTRAVENOUS

## 2016-10-28 MED ORDER — ONDANSETRON HCL 4 MG/2ML IJ SOLN
INTRAMUSCULAR | Status: DC | PRN
  Administered 2016-10-28: 4 mg via INTRAVENOUS

## 2016-10-28 MED ORDER — ROCURONIUM BROMIDE 50 MG/5ML IV SOSY
PREFILLED_SYRINGE | INTRAVENOUS | Status: DC | PRN
  Administered 2016-10-28: 50 mg via INTRAVENOUS

## 2016-10-28 MED ORDER — HYDROMORPHONE HCL 1 MG/ML IJ SOLN
0.5000 mg | Freq: Once | INTRAMUSCULAR | Status: AC
  Administered 2016-10-28: 0.5 mg via INTRAVENOUS
  Filled 2016-10-28: qty 1

## 2016-10-28 MED ORDER — METOPROLOL TARTRATE 5 MG/5ML IV SOLN
2.5000 mg | Freq: Once | INTRAVENOUS | Status: AC
  Administered 2016-10-28: 2.5 mg via INTRAVENOUS

## 2016-10-28 MED ORDER — DEXAMETHASONE SODIUM PHOSPHATE 4 MG/ML IJ SOLN
INTRAMUSCULAR | Status: DC | PRN
  Administered 2016-10-28: 10 mg via INTRAVENOUS

## 2016-10-28 MED ORDER — FENTANYL CITRATE (PF) 100 MCG/2ML IJ SOLN
INTRAMUSCULAR | Status: DC | PRN
  Administered 2016-10-28: 50 ug via INTRAVENOUS
  Administered 2016-10-28: 100 ug via INTRAVENOUS
  Administered 2016-10-28: 50 ug via INTRAVENOUS

## 2016-10-28 MED ORDER — METRONIDAZOLE IN NACL 5-0.79 MG/ML-% IV SOLN
500.0000 mg | Freq: Three times a day (TID) | INTRAVENOUS | Status: DC
  Administered 2016-10-28 – 2016-10-30 (×5): 500 mg via INTRAVENOUS
  Filled 2016-10-28 (×7): qty 100

## 2016-10-28 MED ORDER — SUGAMMADEX SODIUM 200 MG/2ML IV SOLN
INTRAVENOUS | Status: DC | PRN
  Administered 2016-10-28: 200 mg via INTRAVENOUS

## 2016-10-28 MED ORDER — ONDANSETRON 4 MG PO TBDP: 4.0000 mg | ORAL_TABLET | Freq: Four times a day (QID) | ORAL | Status: DC | PRN

## 2016-10-28 MED ORDER — BUPIVACAINE LIPOSOME 1.3 % IJ SUSP
20.0000 mL | Freq: Once | INTRAMUSCULAR | Status: AC
  Administered 2016-10-28: 20 mL
  Filled 2016-10-28: qty 20

## 2016-10-28 MED ORDER — ACETAMINOPHEN 10 MG/ML IV SOLN
INTRAVENOUS | Status: AC
  Filled 2016-10-28: qty 100

## 2016-10-28 MED ORDER — HYDROCODONE-ACETAMINOPHEN 5-325 MG PO TABS
1.0000 | ORAL_TABLET | ORAL | Status: DC | PRN
  Filled 2016-10-28: qty 1

## 2016-10-28 MED ORDER — HYDRALAZINE HCL 20 MG/ML IJ SOLN
INTRAMUSCULAR | Status: AC
  Filled 2016-10-28: qty 1

## 2016-10-28 MED ORDER — PANTOPRAZOLE SODIUM 40 MG IV SOLR
40.0000 mg | Freq: Every day | INTRAVENOUS | Status: DC
  Administered 2016-10-28 – 2016-10-29 (×2): 40 mg via INTRAVENOUS
  Filled 2016-10-28 (×2): qty 40

## 2016-10-28 MED ORDER — FENTANYL CITRATE (PF) 100 MCG/2ML IJ SOLN
INTRAMUSCULAR | Status: AC
  Administered 2016-10-28: 50 ug via INTRAVENOUS
  Filled 2016-10-28: qty 2

## 2016-10-28 MED ORDER — LIDOCAINE 2% (20 MG/ML) 5 ML SYRINGE
INTRAMUSCULAR | Status: AC
  Filled 2016-10-28: qty 5

## 2016-10-28 MED ORDER — LIDOCAINE 2% (20 MG/ML) 5 ML SYRINGE
INTRAMUSCULAR | Status: DC | PRN
  Administered 2016-10-28: 50 mg via INTRAVENOUS

## 2016-10-28 MED ORDER — HEPARIN SODIUM (PORCINE) 5000 UNIT/ML IJ SOLN
5000.0000 [IU] | Freq: Three times a day (TID) | INTRAMUSCULAR | Status: DC
  Administered 2016-10-28 – 2016-10-30 (×5): 5000 [IU] via SUBCUTANEOUS
  Filled 2016-10-28 (×6): qty 1

## 2016-10-28 MED ORDER — 0.9 % SODIUM CHLORIDE (POUR BTL) OPTIME
TOPICAL | Status: DC | PRN
  Administered 2016-10-28: 1000 mL

## 2016-10-28 MED ORDER — FENTANYL CITRATE (PF) 100 MCG/2ML IJ SOLN
25.0000 ug | INTRAMUSCULAR | Status: DC | PRN
  Administered 2016-10-28: 25 ug via INTRAVENOUS
  Administered 2016-10-28: 50 ug via INTRAVENOUS
  Administered 2016-10-28: 25 ug via INTRAVENOUS
  Administered 2016-10-28: 50 ug via INTRAVENOUS

## 2016-10-28 MED ORDER — KCL IN DEXTROSE-NACL 20-5-0.45 MEQ/L-%-% IV SOLN
INTRAVENOUS | Status: DC
  Administered 2016-10-28 – 2016-10-30 (×3): via INTRAVENOUS
  Filled 2016-10-28 (×5): qty 1000

## 2016-10-28 MED ORDER — MEPERIDINE HCL 50 MG/ML IJ SOLN: 6.2500 mg | INTRAMUSCULAR | Status: DC | PRN

## 2016-10-28 MED ORDER — SUGAMMADEX SODIUM 200 MG/2ML IV SOLN
INTRAVENOUS | Status: AC
  Filled 2016-10-28: qty 2

## 2016-10-28 MED ORDER — HYDRALAZINE HCL 20 MG/ML IJ SOLN: 10.0000 mg | INTRAMUSCULAR | Status: DC | PRN

## 2016-10-28 MED ORDER — CIPROFLOXACIN IN D5W 400 MG/200ML IV SOLN
400.0000 mg | Freq: Two times a day (BID) | INTRAVENOUS | Status: DC
  Administered 2016-10-28: 400 mg via INTRAVENOUS
  Filled 2016-10-28: qty 200

## 2016-10-28 MED ORDER — METRONIDAZOLE IN NACL 5-0.79 MG/ML-% IV SOLN
500.0000 mg | Freq: Three times a day (TID) | INTRAVENOUS | Status: DC
  Filled 2016-10-28: qty 100

## 2016-10-28 MED ORDER — DIPHENHYDRAMINE HCL 12.5 MG/5ML PO ELIX: 12.5000 mg | ORAL_SOLUTION | Freq: Four times a day (QID) | ORAL | Status: DC | PRN

## 2016-10-28 MED ORDER — PHENYLEPHRINE 40 MCG/ML (10ML) SYRINGE FOR IV PUSH (FOR BLOOD PRESSURE SUPPORT)
PREFILLED_SYRINGE | INTRAVENOUS | Status: AC
  Filled 2016-10-28: qty 10

## 2016-10-28 MED ORDER — HYDROMORPHONE HCL 1 MG/ML IJ SOLN: 0.5000 mg | INTRAMUSCULAR | Status: DC | PRN

## 2016-10-28 MED ORDER — HYDRALAZINE HCL 20 MG/ML IJ SOLN
INTRAMUSCULAR | Status: DC | PRN
  Administered 2016-10-28: 2 mg via INTRAVENOUS

## 2016-10-28 MED ORDER — CIPROFLOXACIN IN D5W 400 MG/200ML IV SOLN
400.0000 mg | Freq: Two times a day (BID) | INTRAVENOUS | Status: DC
  Administered 2016-10-28 – 2016-10-29 (×3): 400 mg via INTRAVENOUS
  Filled 2016-10-28 (×3): qty 200

## 2016-10-28 MED ORDER — MORPHINE SULFATE (PF) 2 MG/ML IV SOLN
1.0000 mg | INTRAVENOUS | Status: DC | PRN
  Administered 2016-10-28 – 2016-10-29 (×3): 1 mg via INTRAVENOUS
  Filled 2016-10-28 (×3): qty 1

## 2016-10-28 MED ORDER — METRONIDAZOLE IN NACL 5-0.79 MG/ML-% IV SOLN
500.0000 mg | Freq: Once | INTRAVENOUS | Status: AC
  Administered 2016-10-28: 500 mg via INTRAVENOUS
  Filled 2016-10-28: qty 100

## 2016-10-28 MED ORDER — LACTATED RINGERS IV SOLN
INTRAVENOUS | Status: DC
  Administered 2016-10-28: 1000 mL via INTRAVENOUS
  Administered 2016-10-28: 16:00:00 via INTRAVENOUS

## 2016-10-28 MED ORDER — PROPOFOL 10 MG/ML IV BOLUS
INTRAVENOUS | Status: AC
  Filled 2016-10-28: qty 20

## 2016-10-28 MED ORDER — SUCCINYLCHOLINE CHLORIDE 200 MG/10ML IV SOSY
PREFILLED_SYRINGE | INTRAVENOUS | Status: AC
  Filled 2016-10-28: qty 10

## 2016-10-28 MED ORDER — PROPOFOL 10 MG/ML IV BOLUS
INTRAVENOUS | Status: DC | PRN
  Administered 2016-10-28: 150 mg via INTRAVENOUS

## 2016-10-28 MED ORDER — ONDANSETRON HCL 4 MG/2ML IJ SOLN
4.0000 mg | Freq: Once | INTRAMUSCULAR | Status: AC
  Administered 2016-10-28: 4 mg via INTRAVENOUS
  Filled 2016-10-28: qty 2

## 2016-10-28 MED ORDER — ACETAMINOPHEN 325 MG PO TABS: 650.0000 mg | ORAL_TABLET | Freq: Four times a day (QID) | ORAL | Status: DC | PRN

## 2016-10-28 MED ORDER — LACTATED RINGERS IR SOLN
Status: DC | PRN
  Administered 2016-10-28: 1000 mL

## 2016-10-28 MED ORDER — ONDANSETRON HCL 4 MG/2ML IJ SOLN
4.0000 mg | Freq: Four times a day (QID) | INTRAMUSCULAR | Status: DC | PRN
  Administered 2016-10-28 – 2016-10-29 (×2): 4 mg via INTRAVENOUS
  Filled 2016-10-28 (×2): qty 2

## 2016-10-28 MED ORDER — ACETAMINOPHEN 650 MG RE SUPP: 650.0000 mg | Freq: Four times a day (QID) | RECTAL | Status: DC | PRN

## 2016-10-28 MED ORDER — METOPROLOL TARTRATE 5 MG/5ML IV SOLN
INTRAVENOUS | Status: AC
  Administered 2016-10-28: 2.5 mg via INTRAVENOUS
  Filled 2016-10-28: qty 5

## 2016-10-28 MED ORDER — DIPHENHYDRAMINE HCL 50 MG/ML IJ SOLN: 12.5000 mg | Freq: Four times a day (QID) | INTRAMUSCULAR | Status: DC | PRN

## 2016-10-28 MED ORDER — PANTOPRAZOLE SODIUM 40 MG IV SOLR: 40.0000 mg | Freq: Every day | INTRAVENOUS | Status: DC

## 2016-10-28 MED ORDER — FENTANYL CITRATE (PF) 100 MCG/2ML IJ SOLN
INTRAMUSCULAR | Status: AC
  Administered 2016-10-28: 25 ug via INTRAVENOUS
  Filled 2016-10-28: qty 2

## 2016-10-28 MED ORDER — LEVOFLOXACIN IN D5W 500 MG/100ML IV SOLN
500.0000 mg | INTRAVENOUS | Status: DC
  Filled 2016-10-28: qty 100

## 2016-10-28 MED ORDER — PHENYLEPHRINE 40 MCG/ML (10ML) SYRINGE FOR IV PUSH (FOR BLOOD PRESSURE SUPPORT)
PREFILLED_SYRINGE | INTRAVENOUS | Status: DC | PRN
  Administered 2016-10-28 (×2): 80 ug via INTRAVENOUS

## 2016-10-28 MED ORDER — LACTATED RINGERS IV BOLUS (SEPSIS)
500.0000 mL | Freq: Once | INTRAVENOUS | Status: AC
  Administered 2016-10-28: 500 mL via INTRAVENOUS

## 2016-10-28 MED ORDER — IOPAMIDOL (ISOVUE-300) INJECTION 61%
100.0000 mL | Freq: Once | INTRAVENOUS | Status: AC | PRN
  Administered 2016-10-28: 100 mL via INTRAVENOUS

## 2016-10-28 MED ORDER — DEXAMETHASONE SODIUM PHOSPHATE 10 MG/ML IJ SOLN
INTRAMUSCULAR | Status: AC
  Filled 2016-10-28: qty 1

## 2016-10-28 MED ORDER — ACETAMINOPHEN 10 MG/ML IV SOLN
1000.0000 mg | Freq: Once | INTRAVENOUS | Status: AC
  Administered 2016-10-28: 1000 mg via INTRAVENOUS

## 2016-10-28 MED ORDER — ONDANSETRON HCL 4 MG/2ML IJ SOLN
INTRAMUSCULAR | Status: AC
  Filled 2016-10-28: qty 2

## 2016-10-28 SURGICAL SUPPLY — 43 items
ADH SKN CLS APL DERMABOND .7 (GAUZE/BANDAGES/DRESSINGS) ×1
APPLIER CLIP ROT 10 11.4 M/L (STAPLE)
APR CLP MED LRG 11.4X10 (STAPLE)
BAG SPEC RTRVL 10 TROC 200 (ENDOMECHANICALS)
BAG SPEC RTRVL LRG 6X4 10 (ENDOMECHANICALS) ×1
CABLE HIGH FREQUENCY MONO STRZ (ELECTRODE) IMPLANT
CHLORAPREP W/TINT 26ML (MISCELLANEOUS) ×2 IMPLANT
CLIP APPLIE ROT 10 11.4 M/L (STAPLE) IMPLANT
COVER SURGICAL LIGHT HANDLE (MISCELLANEOUS) ×2 IMPLANT
CUTTER FLEX LINEAR 45M (STAPLE) ×2 IMPLANT
DECANTER SPIKE VIAL GLASS SM (MISCELLANEOUS) IMPLANT
DERMABOND ADVANCED (GAUZE/BANDAGES/DRESSINGS) ×1
DERMABOND ADVANCED .7 DNX12 (GAUZE/BANDAGES/DRESSINGS) ×1 IMPLANT
DRAPE LAPAROSCOPIC ABDOMINAL (DRAPES) IMPLANT
ELECT REM PT RETURN 15FT ADLT (MISCELLANEOUS) ×2 IMPLANT
ENDOLOOP SUT PDS II  0 18 (SUTURE)
ENDOLOOP SUT PDS II 0 18 (SUTURE) IMPLANT
GLOVE BIOGEL M 8.0 STRL (GLOVE) ×2 IMPLANT
GOWN STRL REUS W/TWL XL LVL3 (GOWN DISPOSABLE) ×2 IMPLANT
KIT BASIN OR (CUSTOM PROCEDURE TRAY) ×2 IMPLANT
PAD POSITIONING PINK XL (MISCELLANEOUS) ×2 IMPLANT
POUCH RETRIEVAL ECOSAC 10 (ENDOMECHANICALS) IMPLANT
POUCH RETRIEVAL ECOSAC 10MM (ENDOMECHANICALS)
POUCH SPECIMEN RETRIEVAL 10MM (ENDOMECHANICALS) ×2 IMPLANT
RELOAD 45 VASCULAR/THIN (ENDOMECHANICALS) IMPLANT
RELOAD STAPLE 45 2.5 WHT GRN (ENDOMECHANICALS) IMPLANT
RELOAD STAPLE 45 3.5 BLU ETS (ENDOMECHANICALS) IMPLANT
RELOAD STAPLE TA45 3.5 REG BLU (ENDOMECHANICALS) ×2 IMPLANT
SCISSORS LAP 5X45 EPIX DISP (ENDOMECHANICALS) ×2 IMPLANT
SET IRRIG TUBING LAPAROSCOPIC (IRRIGATION / IRRIGATOR) ×2 IMPLANT
SHEARS HARMONIC ACE PLUS 45CM (MISCELLANEOUS) ×2 IMPLANT
SLEEVE XCEL OPT CAN 5 100 (ENDOMECHANICALS) ×2 IMPLANT
STAPLER VISISTAT 35W (STAPLE) IMPLANT
SUT MNCRL AB 4-0 PS2 18 (SUTURE) ×2 IMPLANT
SUT VICRYL 0 UR6 27IN ABS (SUTURE) ×1 IMPLANT
TOWEL OR 17X26 10 PK STRL BLUE (TOWEL DISPOSABLE) ×2 IMPLANT
TRAY FOLEY W/METER SILVER 14FR (SET/KITS/TRAYS/PACK) IMPLANT
TRAY FOLEY W/METER SILVER 16FR (SET/KITS/TRAYS/PACK) IMPLANT
TRAY LAPAROSCOPIC (CUSTOM PROCEDURE TRAY) ×2 IMPLANT
TROCAR BLADELESS OPT 5 100 (ENDOMECHANICALS) ×2 IMPLANT
TROCAR XCEL BLUNT TIP 100MML (ENDOMECHANICALS) ×2 IMPLANT
TROCAR XCEL NON-BLD 11X100MML (ENDOMECHANICALS) IMPLANT
TUBING INSUF HEATED (TUBING) ×2 IMPLANT

## 2016-10-28 NOTE — ED Provider Notes (Signed)
[facility] DEPT [facility] Provider Note   CSN: [id number] Arrival date & time: 10/28/16  0816     History   Chief Complaint Chief Complaint  Patient presents with  . Abdominal Pain    HPI Erika Fowler is a 81 y.o. female.  Patient with hx ulcerative colitis, c/o right lower abd pain, dull, moderate, constant. Pain started a few days ago, was placed on po steroid by pcp, but says pain was worse this AM, so they wanted CT.  +nausea. No vomiting. Did have a few loose stools, streaks of blood. Denies fever or chills. No dysuria or gu c/o. No prior abd surgery.    The history is provided by the patient.  Abdominal Pain   Pertinent negatives include fever and headaches.    Past Medical History:  Diagnosis Date  . Anxiety   . C. difficile enteritis   . Cellulitis 05/10/14  . Colitis, ulcerative ([place])   . H. pylori infection   . Osteopenia   . 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 Uterine prolapse     Patient Active Problem List   Diagnosis Date Noted  . Colitis 02/19/2015  . Elevated bilirubin 02/19/2015  . Leukocytosis 02/19/2015  . Rectal bleeding 02/03/2015  . Helicobacter pylori (H. pylori) infection 02/03/2015  . Osteopenia   . Uterine prolapse   . ANXIETY 08/09/2007  . Ulcerative colitis ([place]) 08/09/2007    Past Surgical History:  Procedure Laterality Date  . COLONOSCOPY    . TONSILLECTOMY    . ulcerative colitis      OB History    Gravida Para Term Preterm AB Living   2 2 2  0 0     SAB TAB Ectopic Multiple Live Births   0 0 0           Home Medications    Prior to Admission medications   Medication Sig Start Date End Date Taking? Authorizing Provider  BUMETANIDE PO Take by mouth.   Yes [provider]  Cholecalciferol (VITAMIN D) 2000 UNITS tablet Take 2,000 Units by mouth daily.     Yes [provider]  escitalopram (LEXAPRO) 10 MG tablet TAKE 1 TABLET DAILY 08/13/14  Yes [name], MD  mesalamine (APRISO) 0.375  G 24 hr capsule Take 3 tablets BID. (6 tablets/day) Patient taking differently: Take 1,125 mg by mouth 2 (two) times daily. Take 3 tablets BID. (6 tablets/day) 12/09/14  Yes Nandigam, Venia Minks, MD  Methylcobalamin (B12-ACTIVE PO) Take by mouth.   Yes [provider]  Multiple Vitamins-Minerals (PRESERVISION/LUTEIN PO) Take 1 tablet by mouth daily.    Yes [provider]  ondansetron (ZOFRAN) 4 MG tablet Take 1 tablet (4 mg total) by mouth every 6 (six) hours. 12/11/15  Yes Leaphart, Zack Seal, PA-C  ranitidine (ZANTAC) 150 MG tablet Take 1 tablet (150 mg total) by mouth 2 (two) times daily. 04/22/14  Yes [name], MD  predniSONE (DELTASONE) 10 MG tablet Take 1 tablet (10 mg total) by mouth daily with breakfast. Take 4 tablets for 3 days. Then take 3 tablets for 4 days. Then take 2 tablets for 5 days. Then take 1 tablet for 7 days. 12/11/15   Doristine Devoid, PA-C  raloxifene (EVISTA) 60 MG tablet Take 60 mg by mouth daily. 12/19/13   [provider]  vancomycin (VANCOCIN HCL) 125 MG capsule Take one po QID x 14 days Patient taking differently: Take 125 mg by mouth 4 (four) times daily. Take  for 14 days starting 12/23 02/06/15   Zehr, Laban Emperor, PA-C    Family History Family History  Problem Relation Age of Onset  . Colon cancer Mother 32  . Heart disease Mother   . Breast cancer Mother        Age 88's  . Pancreatic cancer Father   . Breast cancer Sister        Age 11's  . Hypertension Sister   . Breast cancer Maternal Aunt        Age 59's  . Breast cancer Paternal Aunt        Age 37's  . Inflammatory bowel disease Sister   . Breast cancer Maternal Aunt        Age 51's  . Stomach cancer Neg Hx     Social History Social History  Substance Use Topics  . Smoking status: Never Smoker  . Smokeless tobacco: Never Used  . Alcohol use 4.2 oz/week    7 Standard drinks or equivalent per week     Comment: rarely     Allergies    Penicillins   Review of Systems Review of Systems  Constitutional: Negative for fever.  HENT: Negative for sore throat.   Eyes: Negative for redness.  Respiratory: Negative for shortness of breath.   Cardiovascular: Negative for chest pain.  Gastrointestinal: Positive for abdominal pain.  Genitourinary: Negative for flank pain.  Musculoskeletal: Negative for back pain.  Skin: Negative for rash.  Neurological: Negative for headaches.  Hematological: Does not bruise/bleed easily.  Psychiatric/Behavioral: Negative for confusion.     Physical Exam Updated Vital Signs BP (!) 136/91   Pulse 82   Temp 98.5 F (36.9 C) (Oral)   Resp 13   Ht 1.549 m (5\' 1" )   Wt 59 kg (130 lb)   SpO2 99%   BMI 24.56 kg/m   Physical Exam  Constitutional: She appears well-developed and well-nourished. No distress.  HENT:  Mouth/Throat: Oropharynx is clear and moist.  Eyes: Conjunctivae are normal. No scleral icterus.  Neck: Neck supple. No tracheal deviation present.  Cardiovascular: Normal rate, regular rhythm, normal heart sounds and intact distal pulses.  Exam reveals no gallop and no friction rub.   No murmur heard. Pulmonary/Chest: Effort normal and breath sounds normal. No respiratory distress.  Abdominal: Soft. Normal appearance and bowel sounds are normal. She exhibits no distension and no mass. There is tenderness. There is no rebound and no guarding. No hernia.  RLQ tenderness.   Genitourinary:  Genitourinary Comments: No cva tenderness  Musculoskeletal: She exhibits no edema.  Neurological: She is alert.  Skin: Skin is warm and dry. No rash noted. She is not diaphoretic.  Psychiatric: She has a normal mood and affect.  Nursing note and vitals reviewed.    ED Treatments / Results  Labs (all labs ordered are listed, but only abnormal results are displayed) Results for orders placed or performed during the hospital encounter of 10/28/16  CBC  Result Value Ref Range   WBC 9.8  4.0 - 10.5 K/uL   RBC 4.56 3.87 - 5.11 MIL/uL   Hemoglobin 14.0 12.0 - 15.0 g/dL   HCT 41.5 36.0 - 46.0 %   MCV 91.0 78.0 - 100.0 fL   MCH 30.7 26.0 - 34.0 pg   MCHC 33.7 30.0 - 36.0 g/dL   RDW 14.3 11.5 - 15.5 %   Platelets 332 150 - 400 K/uL  Comprehensive metabolic panel  Result Value Ref Range   Sodium 139 135 -  145 mmol/L   Potassium 3.9 3.5 - 5.1 mmol/L   Chloride 103 101 - 111 mmol/L   CO2 29 22 - 32 mmol/L   Glucose, Bld 106 (H) 65 - 99 mg/dL   BUN 14 6 - 20 mg/dL   Creatinine, Ser 0.68 0.44 - 1.00 mg/dL   Calcium 9.5 8.9 - 10.3 mg/dL   Total Protein 7.0 6.5 - 8.1 g/dL   Albumin 3.7 3.5 - 5.0 g/dL   AST 21 15 - 41 U/L   ALT 15 14 - 54 U/L   Alkaline Phosphatase 68 38 - 126 U/L   Total Bilirubin 1.4 (H) 0.3 - 1.2 mg/dL   GFR calc non Af Amer >60 >60 mL/min   GFR calc Af Amer >60 >60 mL/min   Anion gap 7 5 - 15  Urinalysis, Routine w reflex microscopic  Result Value Ref Range   Color, Urine YELLOW YELLOW   APPearance CLOUDY (A) CLEAR   Specific Gravity, Urine 1.025 1.005 - 1.030   pH 6.0 5.0 - 8.0   Glucose, UA NEGATIVE NEGATIVE mg/dL   Hgb urine dipstick MODERATE (A) NEGATIVE   Bilirubin Urine SMALL (A) NEGATIVE   Ketones, ur 15 (A) NEGATIVE mg/dL   Protein, ur 30 (A) NEGATIVE mg/dL   Nitrite POSITIVE (A) NEGATIVE   Leukocytes, UA MODERATE (A) NEGATIVE  Urinalysis, Microscopic (reflex)  Result Value Ref Range   RBC / HPF 0-5 0 - 5 RBC/hpf   WBC, UA 6-30 0 - 5 WBC/hpf   Bacteria, UA MANY (A) NONE SEEN   Squamous Epithelial / LPF 0-5 (A) NONE SEEN   Mucus PRESENT     EKG  EKG Interpretation None       Radiology Ct Abdomen Pelvis W Contrast  Result Date: 10/28/2016 CLINICAL DATA:  History of ulcerative colitis. Abdominal pain several days with nausea. EXAM: CT ABDOMEN AND PELVIS WITH CONTRAST TECHNIQUE: Multidetector CT imaging of the abdomen and pelvis was performed using the standard protocol following bolus administration of intravenous contrast.  CONTRAST:  114mL ISOVUE-300 IOPAMIDOL (ISOVUE-300) INJECTION 61% COMPARISON:  02/19/2015 and 10/30/2014 FINDINGS: Lower chest: Mild peripheral nodularity over the lingula without significant change likely atelectasis/scarring. Hepatobiliary: Subcentimeter hypodensity over the left lobe of the liver unchanged likely a cyst. Gallbladder and biliary tree are normal. Pancreas: Within normal. Spleen: Within normal. Adrenals/Urinary Tract: Adrenal glands are normal. Kidneys are normal in size. 4 mm calcification over the lower pole of the left kidney unchanged with adjacent lower pole left renal cortical scarring. 1.7 cm right parapelvic cyst. Subtle prominence of the right intrarenal collecting system. Ureters and bladder are within normal. Stomach/Bowel: Stomach and small bowel are within normal. The appendix is enlarged more noticeable proximally where it measures 1.5 cm in diameter. There is mild adjacent inflammatory change in the right lower quadrant and small amount of free fluid. Findings likely due to mild acute appendicitis without evidence of perforation or abscess. The transverse, descending and rectosigmoid colon are decompressed with mild wall thickening of the colon distal to the splenic flexure likely related to patient's ulcerative colitis. Cannot exclude mild active acute colitis. Vascular/Lymphatic: Minimal calcified plaque over the abdominal aorta. Few small scattered mesenteric lymph nodes present. Reproductive: Within normal. Other: No evidence of abdominal wall abnormality. Musculoskeletal: Degenerative change of the spine with multilevel disc disease over the lumbar spine and lower thoracic spine. IMPRESSION: Evidence of mild uncomplicated acute appendicitis. No evidence of perforation or abscess. Mild decompression and wall thickening involving the colon distal to  the splenic flexure likely due to patient's known ulcer colitis. Cannot exclude mild active acute colitis. Subcentimeter liver cyst  unchanged. 1.7 cm right renal cyst. Minimal prominence of the right intrarenal collecting system. These results were called by telephone at the time of interpretation on 10/28/2016 at 10:41 am to Dr. Lajean Saver , who verbally acknowledged these results. Electronically Signed   By: Marin Olp M.D.   On: 10/28/2016 10:41    Procedures Procedures (including critical care time)  Medications Ordered in ED Medications  sodium chloride 0.9 % bolus 1,000 mL (1,000 mLs Intravenous New Bag/Given 10/28/16 0919)  HYDROmorphone (DILAUDID) injection 0.5 mg (0.5 mg Intravenous Given 10/28/16 0922)  ondansetron (ZOFRAN) injection 4 mg (4 mg Intravenous Given 10/28/16 0920)  iopamidol (ISOVUE-300) 61 % injection 100 mL (100 mLs Intravenous Contrast Given 10/28/16 1007)     Initial Impression / Assessment and Plan / ED Course  I have reviewed the triage vital signs and the nursing notes.  Pertinent labs & imaging results that were available during my care of the patient were reviewed by me and considered in my medical decision making (see chart for details).  Iv ns bolus. Dilaudid .5 mg iv. zofran iv.  Reviewed nursing notes and prior charts for additional history.   levaquin and flagyl po.  General surgeon at [facility] paged, Dr Hassell Done - discussed w his APP - they accept in transfer to [facility].    Final Clinical Impressions(s) / ED Diagnoses   Final diagnoses:  None    New Prescriptions New Prescriptions   No medications on file     Lajean Saver, MD 10/28/16 1102

## 2016-10-28 NOTE — ED Notes (Signed)
Pt given PO contrast for CT scan (per protocol hx UC), instructed to drink slowly, will wait for labs

## 2016-10-28 NOTE — ED Triage Notes (Signed)
Pt presents with RLQ pain since Wednesday night -- was seen by GI yesterday (hx of UC and c-diff) and was put on steroids. Also reports bloody diarrhea. Denies fever, n/v.

## 2016-10-28 NOTE — Transfer of Care (Signed)
Immediate Anesthesia Transfer of Care Note  Patient: Erika Fowler  Procedure(s) Performed: Procedure(s): APPENDECTOMY LAPAROSCOPIC (N/A)  Patient Location: PACU  Anesthesia Type:General  Level of Consciousness: Patient easily awoken, sedated, comfortable, cooperative, following commands, responds to stimulation.   Airway & Oxygen Therapy: Patient spontaneously breathing, ventilating well, oxygen via simple oxygen mask.  Post-op Assessment: Report given to PACU RN, vital signs reviewed and stable, moving all extremities.   Post vital signs: Reviewed and stable.  Complications: No apparent anesthesia complications  Last Vitals:  Vitals:   10/28/16 1612 10/28/16 1615  BP: (!) 168/95 (!) 156/87  Pulse: (!) 108 (!) 101  Resp: 20 17  Temp: 36.6 C   SpO2: 100% 100%    Last Pain:  Vitals:   10/28/16 1342  TempSrc:   PainSc: 2       Patients Stated Pain Goal: 4 (46/96/29 5284)  Complications: No apparent anesthesia complications

## 2016-10-28 NOTE — ED Notes (Signed)
Pt educated about not driving or performing other critical tasks (such as operating heavy machinery, caring for infant/toddler/child) due to sedative nature of medications received in ED. Also warned about risks of consuming alcohol or taking other medications with sedative properties. Pt/caregiver verbalized understanding.

## 2016-10-28 NOTE — Discharge Instructions (Signed)
Please arrive at least 30 min before your appointment to complete your check in paperwork.  If you are unable to arrive 30 min prior to your appointment time we may have to cancel or reschedule you. ° °LAPAROSCOPIC SURGERY: POST OP INSTRUCTIONS  °1. DIET: Follow a light bland diet the first 24 hours after arrival home, such as soup, liquids, crackers, etc. Be sure to include lots of fluids daily. Avoid fast food or heavy meals as your are more likely to get nauseated. Eat a low fat the next few days after surgery.  °2. Take your usually prescribed home medications unless otherwise directed. °3. PAIN CONTROL:  °1. Pain is best controlled by a usual combination of three different methods TOGETHER:  °1. Ice/Heat °2. Over the counter pain medication °3. Prescription pain medication °2. Most patients will experience some swelling and bruising around the incisions. Ice packs or heating pads (30-60 minutes up to 6 times a day) will help. Use ice for the first few days to help decrease swelling and bruising, then switch to heat to help relax tight/sore spots and speed recovery. Some people prefer to use ice alone, heat alone, alternating between ice & heat. Experiment to what works for you. Swelling and bruising can take several weeks to resolve.  °3. It is helpful to take an over-the-counter pain medication regularly for the first few weeks. Choose one of the following that works best for you:  °1. Naproxen (Aleve, etc) Two 220mg tabs twice a day °2. Ibuprofen (Advil, etc) Three 200mg tabs four times a day (every meal & bedtime) °3. Acetaminophen (Tylenol, etc) 500-650mg four times a day (every meal & bedtime) °4. A prescription for pain medication (such as oxycodone, hydrocodone, etc) should be given to you upon discharge. Take your pain medication as prescribed.  °1. If you are having problems/concerns with the prescription medicine (does not control pain, nausea, vomiting, rash, itching, etc), please call us [phone]  [phone] to see if we need to switch you to a different pain medicine that will work better for you and/or control your side effect better. °2. If you need a refill on your pain medication, please contact your pharmacy. They will contact our office to request authorization. Prescriptions will not be filled after 5 pm or on week-ends. °4. Avoid getting constipated. Between the surgery and the pain medications, it is common to experience some constipation. Increasing fluid intake and taking a fiber supplement (such as Metamucil, Citrucel, FiberCon, MiraLax, etc) 1-2 times a day regularly will usually help prevent this problem from occurring. A mild laxative (prune juice, Milk of Magnesia, MiraLax, etc) should be taken according to package directions if there are no bowel movements after 48 hours.  °5. Watch out for diarrhea. If you have many loose bowel movements, simplify your diet to bland foods & liquids for a few days. Stop any stool softeners and decrease your fiber supplement. Switching to mild anti-diarrheal medications (Kayopectate, Pepto Bismol) can help. If this worsens or does not improve, please call us. °6. Wash / shower every day. You may shower over the dressings as they are waterproof. Continue to shower over incision(s) after the dressing is off. °7. Remove your waterproof bandages 5 days after surgery. You may leave the incision open to air. You may replace a dressing/Band-Aid to cover the incision for comfort if you wish.  °8. ACTIVITIES as tolerated:  °1. You may resume regular (light) daily activities beginning the next day--such as daily self-care, walking, climbing stairs--gradually   increasing activities as tolerated. If you can walk 30 minutes without difficulty, it is safe to try more intense activity such as jogging, treadmill, bicycling, low-impact aerobics, swimming, etc. °2. Save the most intensive and strenuous activity for last such as sit-ups, heavy lifting, contact sports, etc Refrain  from any heavy lifting or straining until you are off narcotics for pain control.  °3. DO NOT PUSH THROUGH PAIN. Let pain be your guide: If it hurts to do something, don't do it. Pain is your body warning you to avoid that activity for another week until the pain goes down. °4. You may drive when you are no longer taking prescription pain medication, you can comfortably wear a seatbelt, and you can safely maneuver your car and apply brakes. °5. You may have sexual intercourse when it is comfortable.  °9. FOLLOW UP in our office  °1. Please call [facility] at [phone] to set up an appointment to see your surgeon in the office for a follow-up appointment approximately 2-3 weeks after your surgery. °2. Make sure that you call for this appointment the day you arrive home to insure a convenient appointment time. °     10. IF YOU HAVE DISABILITY OR FAMILY LEAVE FORMS, BRING THEM TO THE               OFFICE FOR PROCESSING.  ° °WHEN TO CALL US [phone]:  °1. Poor pain control °2. Reactions / problems with new medications (rash/itching, nausea, etc)  °3. Fever over 101.5 F (38.5 C) °4. Inability to urinate °5. Nausea and/or vomiting °6. Worsening swelling or bruising °7. Continued bleeding from incision. °8. Increased pain, redness, or drainage from the incision ° °The clinic staff is available to answer your questions during regular business hours (8:30am-5pm). Please don’t hesitate to call and ask to speak to one of our nurses for clinical concerns.  °If you have a medical emergency, go to the nearest emergency room or call 911.  °A surgeon from [facility] is always on call at the hospitals  ° °[facility]  °[address], [address], , [place] [address] ?  °MAIN: [phone] ? TOLL FREE: [phone] ?  °FAX [phone]  °[email or web address] ° °

## 2016-10-28 NOTE — Anesthesia Preprocedure Evaluation (Addendum)
Anesthesia Evaluation  Patient identified by MRN, date of birth, ID band Patient awake    Reviewed: Allergy & Precautions, NPO status , Patient's Chart, lab work & pertinent test results  Airway Mallampati: II  TM Distance: >3 FB Neck ROM: Full    Dental no notable dental hx. (+) Caps, Implants, Teeth Intact   Pulmonary neg pulmonary ROS,    Pulmonary exam normal breath sounds clear to auscultation       Cardiovascular negative cardio ROS Normal cardiovascular exam Rhythm:Regular Rate:Normal     Neuro/Psych Anxiety negative neurological ROS  negative psych ROS   GI/Hepatic negative GI ROS, Neg liver ROS, PUD,   Endo/Other  negative endocrine ROS  Renal/GU negative Renal ROS  negative genitourinary   Musculoskeletal negative musculoskeletal ROS (+)   Abdominal   Peds negative pediatric ROS (+)  Hematology negative hematology ROS (+)   Anesthesia Other Findings   Reproductive/Obstetrics negative OB ROS                            Anesthesia Physical Anesthesia Plan  ASA: III  Anesthesia Plan: General   Post-op Pain Management:    Induction: Intravenous  PONV Risk Score and Plan: 2 and Ondansetron, Dexamethasone, Treatment may vary due to age or medical condition and Midazolam  Airway Management Planned: Oral ETT  Additional Equipment:   Intra-op Plan:   Post-operative Plan: Extubation in OR  Informed Consent:   Plan Discussed with:   Anesthesia Plan Comments: (  )        Anesthesia Quick Evaluation

## 2016-10-28 NOTE — ED Notes (Signed)
ED Provider at bedside. 

## 2016-10-28 NOTE — Op Note (Signed)
Surgeon: Kaylyn Lim, MD, FACS  Asst:  none  Anes:  general  Preop Dx: Acute appendicitis Postop Dx: same  Procedure: Laparoscopic appendectomy Location Surgery: [facility] 2 Complications: None noted  EBL:   minimal cc  Drains: none  Description of Procedure:  The patient was taken to OR 2 .  After anesthesia was administered and the patient was prepped a timeout was performed.  Access achieved with [facility] placed in the umbilicus.  Two 5 mm trocars were placed in the right upper quadrant and the left lower quadrant.  The appendix and the dilated part of the appendix were noted.  The cecum and colon had chronic changes consistent with ulcerative colitis.  The cecum was partially mobilized and the appendix was gasped at the tip and the mesentery of the appendix was divided with the Harmonic scalpel.  The base was isolated and I chose a blue load for the Ethicon 4.5 cm stapler.  This was applied at the base and held in place for 30 before firing.  The staple line appeared intact and was not bleeding.  Omentum was brought down and placed on this area of dissection.    The port sites were injected with Exparel and the umbilical site was closed with a single 0 vicryl.  The skin was closed with 4-0 Monocryl and Dermabond.    The patient tolerated the procedure well and was taken to the PACU in stable condition.     Matt B. Hassell Done, [place], [facility], [place]

## 2016-10-28 NOTE — Progress Notes (Signed)
Pt is flagged for precautions due to loose watery stools in past few days.  Pt said it was due to her colitis. Infection Prevention was consulted as as long as orders for c-diff test was not issued, no isolation precautions are needed.

## 2016-10-28 NOTE — ED Notes (Signed)
Patient transported to CT 

## 2016-10-28 NOTE — Anesthesia Procedure Notes (Signed)
Procedure Name: Intubation Date/Time: 10/28/2016 2:51 PM Performed by: Deliah Boston Pre-anesthesia Checklist: Patient identified, Emergency Drugs available, Suction available and Patient being monitored Patient Re-evaluated:Patient Re-evaluated prior to induction Oxygen Delivery Method: Circle system utilized Preoxygenation: Pre-oxygenation with 100% oxygen Induction Type: IV induction Ventilation: Mask ventilation without difficulty Laryngoscope Size: Mac and 3 Grade View: Grade I Tube type: Oral Tube size: 7.0 mm Number of attempts: 1 Airway Equipment and Method: Stylet and Oral airway Placement Confirmation: ETT inserted through vocal cords under direct vision,  positive ETCO2 and breath sounds checked- equal and bilateral Secured at: 22 cm Tube secured with: Tape Dental Injury: Teeth and Oropharynx as per pre-operative assessment

## 2016-10-28 NOTE — H&P (Signed)
[facility] Admission Note  Erika Fowler Aug 07, 1935  [id number].    Requesting MD: Ashok Cordia, MD Chief Complaint/Reason for Consult: acute appendicitis   HPI:  81 y/o female with a PMH Ulcerative Colitis followed by Dr. Teena Irani on mesalamine who presented to [facility] as a transfer from [place] after being diagnosed with acute appendicitis. She endorses constant, non-radiating, RLQ pain that started 3 days ago. Associated with nausea. She was placed on PO steroids by PCP and when pain did not improve patient presented to the ED for a CT scan of the abdomen. CT scan significant for dilated appendix (1.5 cm) and peri-appendiceal inflammation. The patient denies fever, chills, CP, SOB. She denies PMH CVA, MI, DM, or colon cancer. She reports a family history of colon cancer (mother), pancreatic cancer (father), and breast cancer (sister, maternal aunt). She denies previous complications under general anesthesia. She denies the use of blood thinning medications.   ROS: ROS  Family History  Problem Relation Age of Onset  . Colon cancer Mother 44  . Heart disease Mother   . Breast cancer Mother        Age 79's  . Pancreatic cancer Father   . Breast cancer Sister        Age 55's  . Hypertension Sister   . Breast cancer Maternal Aunt        Age 17's  . Breast cancer Paternal Aunt        Age 25's  . Inflammatory bowel disease Sister   . Breast cancer Maternal Aunt        Age 28's  . Stomach cancer Neg Hx     Past Medical History:  Diagnosis Date  . Anxiety   . C. difficile enteritis   . Cellulitis 05/10/14  . Colitis, ulcerative ([place])   . H. pylori infection   . Osteopenia   . 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 Uterine prolapse     Past Surgical History:  Procedure Laterality Date  . COLONOSCOPY    . TONSILLECTOMY    . ulcerative colitis      Social History:  reports that she has never smoked. She has never used smokeless tobacco. She reports  that she drinks about 4.2 oz of alcohol per week . She reports that she does not use drugs.  Allergies:  Allergies  Allergen Reactions  . Penicillins     Per pt: unknown    Medications Prior to Admission  Medication Sig Dispense Refill  . BUMETANIDE PO Take by mouth.    . Cholecalciferol (VITAMIN D) 2000 UNITS tablet Take 2,000 Units by mouth daily.      Marland Kitchen escitalopram (LEXAPRO) 10 MG tablet TAKE 1 TABLET DAILY 90 tablet 1  . mesalamine (APRISO) 0.375 G 24 hr capsule Take 3 tablets BID. (6 tablets/day) (Patient taking differently: Take 1,125 mg by mouth 2 (two) times daily. Take 3 tablets BID. (6 tablets/day)) 180 capsule 1  . Methylcobalamin (B12-ACTIVE PO) Take by mouth.    . Multiple Vitamins-Minerals (PRESERVISION/LUTEIN PO) Take 1 tablet by mouth daily.     . ondansetron (ZOFRAN) 4 MG tablet Take 1 tablet (4 mg total) by mouth every 6 (six) hours. 12 tablet 0  . ranitidine (ZANTAC) 150 MG tablet Take 1 tablet (150 mg total) by mouth 2 (two) times daily. 60 tablet 11  . predniSONE (DELTASONE) 10 MG tablet Take 1 tablet (10 mg total) by mouth daily with breakfast. Take 4 tablets for 3  days. Then take 3 tablets for 4 days. Then take 2 tablets for 5 days. Then take 1 tablet for 7 days. 41 tablet 0  . raloxifene (EVISTA) 60 MG tablet Take 60 mg by mouth daily.  1  . vancomycin (VANCOCIN HCL) 125 MG capsule Take one po QID x 14 days (Patient taking differently: Take 125 mg by mouth 4 (four) times daily. Take for 14 days starting 12/23) 56 capsule 0    Blood pressure 102/88, pulse 92, temperature 98.5 F (36.9 C), temperature source Oral, resp. rate 16, height '5\' 1"'$  (1.549 m), weight 59 kg (130 lb), SpO2 95 %. Physical Exam: Physical Exam   HEENT unremarkable Neck supple Chest clear Heart SR without murmurs Abdomen tender in the right lower quadrant  Ext no hx of DVT  Results for orders placed or performed during the hospital encounter of 10/28/16 (from the past 48 hour(s))   Urinalysis, Routine w reflex microscopic     Status: Abnormal   Collection Time: 10/28/16  8:52 AM  Result Value Ref Range   Color, Urine YELLOW YELLOW   APPearance CLOUDY (A) CLEAR   Specific Gravity, Urine 1.025 1.005 - 1.030   pH 6.0 5.0 - 8.0   Glucose, UA NEGATIVE NEGATIVE mg/dL   Hgb urine dipstick MODERATE (A) NEGATIVE   Bilirubin Urine SMALL (A) NEGATIVE   Ketones, ur 15 (A) NEGATIVE mg/dL   Protein, ur 30 (A) NEGATIVE mg/dL   Nitrite POSITIVE (A) NEGATIVE   Leukocytes, UA MODERATE (A) NEGATIVE  Urinalysis, Microscopic (reflex)     Status: Abnormal   Collection Time: 10/28/16  8:52 AM  Result Value Ref Range   RBC / HPF 0-5 0 - 5 RBC/hpf   WBC, UA 6-30 0 - 5 WBC/hpf   Bacteria, UA MANY (A) NONE SEEN   Squamous Epithelial / LPF 0-5 (A) NONE SEEN   Mucus PRESENT   CBC     Status: None   Collection Time: 10/28/16  9:10 AM  Result Value Ref Range   WBC 9.8 4.0 - 10.5 K/uL   RBC 4.56 3.87 - 5.11 MIL/uL   Hemoglobin 14.0 12.0 - 15.0 g/dL   HCT 41.5 36.0 - 46.0 %   MCV 91.0 78.0 - 100.0 fL   MCH 30.7 26.0 - 34.0 pg   MCHC 33.7 30.0 - 36.0 g/dL   RDW 14.3 11.5 - 15.5 %   Platelets 332 150 - 400 K/uL  Comprehensive metabolic panel     Status: Abnormal   Collection Time: 10/28/16  9:10 AM  Result Value Ref Range   Sodium 139 135 - 145 mmol/L   Potassium 3.9 3.5 - 5.1 mmol/L   Chloride 103 101 - 111 mmol/L   CO2 29 22 - 32 mmol/L   Glucose, Bld 106 (H) 65 - 99 mg/dL   BUN 14 6 - 20 mg/dL   Creatinine, Ser 0.68 0.44 - 1.00 mg/dL   Calcium 9.5 8.9 - 10.3 mg/dL   Total Protein 7.0 6.5 - 8.1 g/dL   Albumin 3.7 3.5 - 5.0 g/dL   AST 21 15 - 41 U/L   ALT 15 14 - 54 U/L   Alkaline Phosphatase 68 38 - 126 U/L   Total Bilirubin 1.4 (H) 0.3 - 1.2 mg/dL   GFR calc non Af Amer >60 >60 mL/min   GFR calc Af Amer >60 >60 mL/min    Comment: (NOTE) The eGFR has been calculated using the CKD EPI equation. This calculation has not been validated  in all clinical situations. eGFR's  persistently <60 mL/min signify possible Chronic Kidney Disease.    Anion gap 7 5 - 15   Ct Abdomen Pelvis W Contrast  Result Date: 10/28/2016 CLINICAL DATA:  History of ulcerative colitis. Abdominal pain several days with nausea. EXAM: CT ABDOMEN AND PELVIS WITH CONTRAST TECHNIQUE: Multidetector CT imaging of the abdomen and pelvis was performed using the standard protocol following bolus administration of intravenous contrast. CONTRAST:  113m ISOVUE-300 IOPAMIDOL (ISOVUE-300) INJECTION 61% COMPARISON:  02/19/2015 and 10/30/2014 FINDINGS: Lower chest: Mild peripheral nodularity over the lingula without significant change likely atelectasis/scarring. Hepatobiliary: Subcentimeter hypodensity over the left lobe of the liver unchanged likely a cyst. Gallbladder and biliary tree are normal. Pancreas: Within normal. Spleen: Within normal. Adrenals/Urinary Tract: Adrenal glands are normal. Kidneys are normal in size. 4 mm calcification over the lower pole of the left kidney unchanged with adjacent lower pole left renal cortical scarring. 1.7 cm right parapelvic cyst. Subtle prominence of the right intrarenal collecting system. Ureters and bladder are within normal. Stomach/Bowel: Stomach and small bowel are within normal. The appendix is enlarged more noticeable proximally where it measures 1.5 cm in diameter. There is mild adjacent inflammatory change in the right lower quadrant and small amount of free fluid. Findings likely due to mild acute appendicitis without evidence of perforation or abscess. The transverse, descending and rectosigmoid colon are decompressed with mild wall thickening of the colon distal to the splenic flexure likely related to patient's ulcerative colitis. Cannot exclude mild active acute colitis. Vascular/Lymphatic: Minimal calcified plaque over the abdominal aorta. Few small scattered mesenteric lymph nodes present. Reproductive: Within normal. Other: No evidence of abdominal wall  abnormality. Musculoskeletal: Degenerative change of the spine with multilevel disc disease over the lumbar spine and lower thoracic spine. IMPRESSION: Evidence of mild uncomplicated acute appendicitis. No evidence of perforation or abscess. Mild decompression and wall thickening involving the colon distal to the splenic flexure likely due to patient's known ulcer colitis. Cannot exclude mild active acute colitis. Subcentimeter liver cyst unchanged. 1.7 cm right renal cyst. Minimal prominence of the right intrarenal collecting system. These results were called by telephone at the time of interpretation on 10/28/2016 at 10:41 am to Dr. KLajean Saver, who verbally acknowledged these results. Electronically Signed   By: DMarin OlpM.D.   On: 10/28/2016 10:41   Assessment/Plan 1.  Admit to OR for appendectomy 2.  NPO, bowel rest, IVF, pain control, antiemetics, antibiotics (Cipro and Flagyl) 3.  SCD's and lovenox for DVT proph 4.  Ambulate and IS  EJill Alexanders [facility] 10/28/2016, 1:34 PM Pager: [phone]: [phone]-Fri 7:00 am-4:30 pm Sat-Sun 7:00 am-11:30 am

## 2016-10-29 NOTE — Progress Notes (Signed)
1 Day Post-Op   Subjective/Chief Complaint: Had some nausea last night.  Has had some flatus and some runny stool c/w her colitis. Has only had a little bit of water to drink so far.      Objective: Vital signs in last 24 hours: Temp:  [97.8 F (36.6 C)-98.3 F (36.8 C)] 98.3 F (36.8 C) (09/15 0545) Pulse Rate:  [77-119] 77 (09/15 0545) Resp:  [13-22] 18 (09/15 0545) BP: (102-168)/(66-97) 120/97 (09/15 0545) SpO2:  [93 %-100 %] 96 % (09/15 0545) Weight:  [59 kg (130 lb)] 59 kg (130 lb) (09/14 1342)    Intake/Output from previous day: 09/14 0701 - 09/15 0700 In: 3963.3 [I.V.:2763.3; IV Piggyback:1200] Out: 725 [Urine:700; Blood:25] Intake/Output this shift: No intake/output data recorded.  General appearance: alert, cooperative and no distress Resp: breathing comfortably Cardio: regular rate and rhythm GI: soft, slightly distended, incisions tender appropriately.  no r/g.   Extremities: extremities normal, atraumatic, no cyanosis or edema  Lab Results:   Recent Labs  10/28/16 0910  WBC 9.8  HGB 14.0  HCT 41.5  PLT 332   BMET  Recent Labs  10/28/16 0910  NA 139  K 3.9  CL 103  CO2 29  GLUCOSE 106*  BUN 14  CREATININE 0.68  CALCIUM 9.5   PT/INR No results for input(s): LABPROT, INR in the last 72 hours. ABG No results for input(s): PHART, HCO3 in the last 72 hours.  Invalid input(s): PCO2, PO2  Studies/Results: Ct Abdomen Pelvis W Contrast  Result Date: 10/28/2016 CLINICAL DATA:  History of ulcerative colitis. Abdominal pain several days with nausea. EXAM: CT ABDOMEN AND PELVIS WITH CONTRAST TECHNIQUE: Multidetector CT imaging of the abdomen and pelvis was performed using the standard protocol following bolus administration of intravenous contrast. CONTRAST:  11mL ISOVUE-300 IOPAMIDOL (ISOVUE-300) INJECTION 61% COMPARISON:  02/19/2015 and 10/30/2014 FINDINGS: Lower chest: Mild peripheral nodularity over the lingula without significant change likely  atelectasis/scarring. Hepatobiliary: Subcentimeter hypodensity over the left lobe of the liver unchanged likely a cyst. Gallbladder and biliary tree are normal. Pancreas: Within normal. Spleen: Within normal. Adrenals/Urinary Tract: Adrenal glands are normal. Kidneys are normal in size. 4 mm calcification over the lower pole of the left kidney unchanged with adjacent lower pole left renal cortical scarring. 1.7 cm right parapelvic cyst. Subtle prominence of the right intrarenal collecting system. Ureters and bladder are within normal. Stomach/Bowel: Stomach and small bowel are within normal. The appendix is enlarged more noticeable proximally where it measures 1.5 cm in diameter. There is mild adjacent inflammatory change in the right lower quadrant and small amount of free fluid. Findings likely due to mild acute appendicitis without evidence of perforation or abscess. The transverse, descending and rectosigmoid colon are decompressed with mild wall thickening of the colon distal to the splenic flexure likely related to patient's ulcerative colitis. Cannot exclude mild active acute colitis. Vascular/Lymphatic: Minimal calcified plaque over the abdominal aorta. Few small scattered mesenteric lymph nodes present. Reproductive: Within normal. Other: No evidence of abdominal wall abnormality. Musculoskeletal: Degenerative change of the spine with multilevel disc disease over the lumbar spine and lower thoracic spine. IMPRESSION: Evidence of mild uncomplicated acute appendicitis. No evidence of perforation or abscess. Mild decompression and wall thickening involving the colon distal to the splenic flexure likely due to patient's known ulcer colitis. Cannot exclude mild active acute colitis. Subcentimeter liver cyst unchanged. 1.7 cm right renal cyst. Minimal prominence of the right intrarenal collecting system. These results were called by telephone at the time  of interpretation on 10/28/2016 at 10:41 am to Dr. Lajean Saver , who verbally acknowledged these results. Electronically Signed   By: Marin Olp M.D.   On: 10/28/2016 10:41    Anti-infectives: Anti-infectives    Start     Dose/Rate Route Frequency Ordered Stop   10/28/16 2300  ciprofloxacin (CIPRO) IVPB 400 mg     400 mg 200 mL/hr over 60 Minutes Intravenous Every 12 hours 10/28/16 1833     10/28/16 1930  metroNIDAZOLE (FLAGYL) IVPB 500 mg     500 mg 100 mL/hr over 60 Minutes Intravenous Every 8 hours 10/28/16 1833     10/28/16 1115  ciprofloxacin (CIPRO) IVPB 400 mg  Status:  Discontinued     400 mg 200 mL/hr over 60 Minutes Intravenous Every 12 hours 10/28/16 1108 10/28/16 1833   10/28/16 1115  metroNIDAZOLE (FLAGYL) IVPB 500 mg  Status:  Discontinued     500 mg 100 mL/hr over 60 Minutes Intravenous Every 8 hours 10/28/16 1108 10/28/16 1833   10/28/16 1100  levofloxacin (LEVAQUIN) IVPB 500 mg  Status:  Discontinued     500 mg 100 mL/hr over 60 Minutes Intravenous Every 24 hours 10/28/16 1046 10/28/16 1833   10/28/16 1100  metroNIDAZOLE (FLAGYL) IVPB 500 mg     500 mg 100 mL/hr over 60 Minutes Intravenous  Once 10/28/16 1046 10/28/16 1214      Assessment/Plan: s/p Procedure(s): APPENDECTOMY LAPAROSCOPIC (N/A) Advance diet as tolerated. Anticipate pt will need to be here until tomorrow.  She is older, and she is having issues with her bowels due to her colitis.   SCDs/heparin for VTE prophylaxis. PRN antihypertensives Encouraged her to try oral pain medication later as diet advanced.     LOS: 1 day    [facility] 10/29/2016

## 2016-10-30 MED ORDER — METRONIDAZOLE 250 MG PO TABS: 250.0000 mg | ORAL_TABLET | Freq: Three times a day (TID) | ORAL | 0 refills | Status: AC

## 2016-10-30 MED ORDER — CIPROFLOXACIN HCL 500 MG PO TABS: 500.0000 mg | ORAL_TABLET | Freq: Two times a day (BID) | ORAL | 2 refills | Status: AC

## 2016-10-30 NOTE — Care Management CC44 (Signed)
Condition Code 44 Documentation Completed  Patient Details  Name: Erika Fowler MRN: [id number] Date of Birth: March 25, 1935   Condition Code 44 given:  Yes Patient signature on Condition Code 44 notice:  Yes Documentation of 2 MD's agreement:  Yes Code 44 added to claim:  Yes    Erenest Rasher, RN 10/30/2016, 10:16 AM

## 2016-10-30 NOTE — Progress Notes (Signed)
Patient ID: Erika Fowler, female   DOB: [birth year], 81 y.o.   MRN: [id number] 2 Days Post-Op   Subjective: No complaints this morning. Denies pain or nausea. 8 breakfast. She is having some diarrhea secondary to her ulcerative colitis.  Objective: Vital signs in last 24 hours: Temp:  [98.5 F (36.9 C)-98.9 F (37.2 C)] 98.5 F (36.9 C) (09/16 0554) Pulse Rate:  [86-97] 86 (09/16 0554) Resp:  [18] 18 (09/16 0554) BP: (102-140)/(66-81) 134/81 (09/16 0554) SpO2:  [95 %-97 %] 95 % (09/16 0554) Last BM Date: 10/29/16  Intake/Output from previous day: 09/15 0701 - 09/16 0700 In: 3800 [P.O.:1200; I.V.:1900; IV Piggyback:700] Out: -  Intake/Output this shift: No intake/output data recorded.  General appearance: alert, cooperative and no distress GI: normal findings: soft, non-tender Incision/Wound: No erythema or drainage  Lab Results:   Recent Labs  10/28/16 0910  WBC 9.8  HGB 14.0  HCT 41.5  PLT 332   BMET  Recent Labs  10/28/16 0910  NA 139  K 3.9  CL 103  CO2 29  GLUCOSE 106*  BUN 14  CREATININE 0.68  CALCIUM 9.5     Studies/Results: Ct Abdomen Pelvis W Contrast  Result Date: 10/28/2016 CLINICAL DATA:  History of ulcerative colitis. Abdominal pain several days with nausea. EXAM: CT ABDOMEN AND PELVIS WITH CONTRAST TECHNIQUE: Multidetector CT imaging of the abdomen and pelvis was performed using the standard protocol following bolus administration of intravenous contrast. CONTRAST:  142mL ISOVUE-300 IOPAMIDOL (ISOVUE-300) INJECTION 61% COMPARISON:  02/19/2015 and 10/30/2014 FINDINGS: Lower chest: Mild peripheral nodularity over the lingula without significant change likely atelectasis/scarring. Hepatobiliary: Subcentimeter hypodensity over the left lobe of the liver unchanged likely a cyst. Gallbladder and biliary tree are normal. Pancreas: Within normal. Spleen: Within normal. Adrenals/Urinary Tract: Adrenal glands are normal. Kidneys are normal in size. 4 mm  calcification over the lower pole of the left kidney unchanged with adjacent lower pole left renal cortical scarring. 1.7 cm right parapelvic cyst. Subtle prominence of the right intrarenal collecting system. Ureters and bladder are within normal. Stomach/Bowel: Stomach and small bowel are within normal. The appendix is enlarged more noticeable proximally where it measures 1.5 cm in diameter. There is mild adjacent inflammatory change in the right lower quadrant and small amount of free fluid. Findings likely due to mild acute appendicitis without evidence of perforation or abscess. The transverse, descending and rectosigmoid colon are decompressed with mild wall thickening of the colon distal to the splenic flexure likely related to patient's ulcerative colitis. Cannot exclude mild active acute colitis. Vascular/Lymphatic: Minimal calcified plaque over the abdominal aorta. Few small scattered mesenteric lymph nodes present. Reproductive: Within normal. Other: No evidence of abdominal wall abnormality. Musculoskeletal: Degenerative change of the spine with multilevel disc disease over the lumbar spine and lower thoracic spine. IMPRESSION: Evidence of mild uncomplicated acute appendicitis. No evidence of perforation or abscess. Mild decompression and wall thickening involving the colon distal to the splenic flexure likely due to patient's known ulcer colitis. Cannot exclude mild active acute colitis. Subcentimeter liver cyst unchanged. 1.7 cm right renal cyst. Minimal prominence of the right intrarenal collecting system. These results were called by telephone at the time of interpretation on 10/28/2016 at 10:41 am to Dr. Lajean Saver , who verbally acknowledged these results. Electronically Signed   By: Marin Olp M.D.   On: 10/28/2016 10:41    Anti-infectives: Anti-infectives    Start     Dose/Rate Route Frequency Ordered Stop   10/28/16 2300  ciprofloxacin (CIPRO) IVPB 400 mg     400 mg 200 mL/hr over 60  Minutes Intravenous Every 12 hours 10/28/16 1833     10/28/16 1930  metroNIDAZOLE (FLAGYL) IVPB 500 mg     500 mg 100 mL/hr over 60 Minutes Intravenous Every 8 hours 10/28/16 1833     10/28/16 1115  ciprofloxacin (CIPRO) IVPB 400 mg  Status:  Discontinued     400 mg 200 mL/hr over 60 Minutes Intravenous Every 12 hours 10/28/16 1108 10/28/16 1833   10/28/16 1115  metroNIDAZOLE (FLAGYL) IVPB 500 mg  Status:  Discontinued     500 mg 100 mL/hr over 60 Minutes Intravenous Every 8 hours 10/28/16 1108 10/28/16 1833   10/28/16 1100  levofloxacin (LEVAQUIN) IVPB 500 mg  Status:  Discontinued     500 mg 100 mL/hr over 60 Minutes Intravenous Every 24 hours 10/28/16 1046 10/28/16 1833   10/28/16 1100  metroNIDAZOLE (FLAGYL) IVPB 500 mg     500 mg 100 mL/hr over 60 Minutes Intravenous  Once 10/28/16 1046 10/28/16 1214      Assessment/Plan: s/p Procedure(s): APPENDECTOMY LAPAROSCOPIC Doing well postoperatively. Okay for discharge. Will complete a course of antibiotics at home with Cipro/Flagyl 5 days Having diarrhea secondary to ulcerative colitis. Was just starting course of prednisone when this illness began. I believe would be safe to restart her prednisone as she has no signs of active infection at present.   LOS: 2 days    Erika Fowler 10/30/2016

## 2016-10-30 NOTE — Care Management Note (Signed)
Case Management Note  Patient Details  Name: Erika Fowler MRN: [id number] Date of Birth: 1935-12-11  Subjective/Objective:     Acute appendicitis, Lap Appendectomy               Action/Plan: Discharge Planning: Spoke to pt and son, Joe at bedside. Pt was independent prior to admission. Pt has good family support.   PCP Mayra Neer MD  Expected Discharge Date:  10/30/16               Expected Discharge Plan:  Home/Self Care  In-House Referral:  NA  Discharge planning Services  CM Consult  Post Acute Care Choice:  NA Choice offered to:  NA  DME Arranged:  N/A DME Agency:  NA  HH Arranged:  NA HH Agency:  NA  Status of Service:  Completed, signed off  If discussed at [place] of Stay Meetings, dates discussed:    Additional Comments:  Erenest Rasher, RN 10/30/2016, 11:25 AM

## 2016-10-30 NOTE — Care Management Obs Status (Signed)
[place] NOTIFICATION   Patient Details  Name: Erika Fowler MRN: [id number] Date of Birth: [birth year]   Medicare Observation Status Notification Given:  Yes    Erenest Rasher, RN 10/30/2016, 10:16 AM

## 2016-10-30 NOTE — Progress Notes (Signed)
Assessment unchanged. Pt and family verbalized understanding of dc instructions through teach back including follow up care, meds, acitvity to resume, and when to call the doctor. Script x 2 given as provided by MD. Discharged via wc to front entrance accompanied by NT and family.

## 2016-10-30 NOTE — Discharge Summary (Signed)
  Patient ID: Erika Fowler [id number] 81 y.o. 05-16-1935  10/28/2016  Discharge date and time: 10/30/2016   Admitting Physician: Excell Seltzer T  Discharge Physician: Excell Seltzer T  Admission Diagnoses: Acute appendicitis, unspecified acute appendicitis type [K35.80] Acute appendicitis [K35.80]  Discharge Diagnoses: Same  Operations: Procedure(s): APPENDECTOMY LAPAROSCOPIC  Admission Condition: fair  Discharged Condition: good  Indication for Admission: Patient with ulcerative colitis presented with 3 days of right lower quadrant abdominal pain. CT scan showed a dilated appendix and periappendiceal inflammation. She was admitted with an impression of acute appendicitis.  Hospital Course: Patient was started on IV antibiotics and taken to the operating room where she was found to have a severe appendicitis without perforation or gangrene and underwent laparoscopic appendectomy. She was maintained on antibiotics postoperatively. Over the next 48 hours she steadily improved. At the time of discharge she is pain-free. Afebrile. Abdomen is nontender. Wounds healing well.   Disposition: Home  Patient Instructions:  Allergies as of 10/30/2016      Reactions   Penicillins    Per pt: unknown Has patient had a PCN reaction causing immediate rash, facial/tongue/throat swelling, SOB or lightheadedness with hypotension:no Has patient had a PCN reaction causing severe rash involving mucus membranes or skin necrosis: no Has patient had a PCN reaction that required hospitalization: no Has patient had a PCN reaction occurring within the last 10 years: no If all of the above answers are "NO", then may proceed with Cephalosporin use.      Medication List    TAKE these medications   B12-ACTIVE PO Take by mouth.   budesonide 3 MG 24 hr capsule Commonly known as:  ENTOCORT EC Take 9 mg by mouth daily.   ciprofloxacin 500 MG tablet Commonly known as:  CIPRO Take 1 tablet (500  mg total) by mouth 2 (two) times daily.   escitalopram 10 MG tablet Commonly known as:  LEXAPRO TAKE 1 TABLET DAILY   mesalamine 0.375 g 24 hr capsule Commonly known as:  APRISO Take 3 tablets BID. (6 tablets/day) What changed:  how much to take  how to take this  when to take this  additional instructions   metroNIDAZOLE 250 MG tablet Commonly known as:  FLAGYL Take 1 tablet (250 mg total) by mouth 3 (three) times daily.   ondansetron 4 MG tablet Commonly known as:  ZOFRAN Take 1 tablet (4 mg total) by mouth every 6 (six) hours.   PRESERVISION/LUTEIN PO Take 1 tablet by mouth daily.   ranitidine 150 MG tablet Commonly known as:  ZANTAC Take 1 tablet (150 mg total) by mouth 2 (two) times daily.   Vitamin D 2000 units tablet Take 2,000 Units by mouth daily.            Discharge Care Instructions        Start     Ordered   10/30/16 0000  ciprofloxacin (CIPRO) 500 MG tablet  2 times daily     10/30/16 0914   10/30/16 0000  metroNIDAZOLE (FLAGYL) 250 MG tablet  3 times daily     10/30/16 4098      Activity: activity as tolerated Diet: regular diet Wound Care: none needed  Follow-up:  With [facility] surgery in 2 weeks.  Signed: Edward Jolly MD, FACS  10/30/2016, 9:15 AM

## 2016-10-31 ENCOUNTER — Encounter (HOSPITAL_COMMUNITY): Payer: Self-pay | Admitting: Surgery

## 2016-10-31 NOTE — Anesthesia Postprocedure Evaluation (Signed)
Anesthesia Post Note  Patient: Erika Fowler  Procedure(s) Performed: Procedure(s) (LRB): APPENDECTOMY LAPAROSCOPIC (N/A)     Patient location during evaluation: PACU Anesthesia Type: General Level of consciousness: awake and alert Pain management: pain level controlled Vital Signs Assessment: post-procedure vital signs reviewed and stable Respiratory status: spontaneous breathing, nonlabored ventilation, respiratory function stable and patient connected to nasal cannula oxygen Cardiovascular status: blood pressure returned to baseline and stable Postop Assessment: no apparent nausea or vomiting Anesthetic complications: no    Last Vitals:  Vitals:   10/29/16 2211 10/30/16 0554  BP: 140/81 134/81  Pulse: 97 86  Resp: 18 18  Temp: 37.2 C 36.9 C  SpO2: 97% 95%    Last Pain:  Vitals:   10/30/16 0800  TempSrc:   PainSc: 0-No pain                 Phaedra Colgate

## 2016-11-10 ENCOUNTER — Other Ambulatory Visit: Payer: Self-pay | Admitting: Family Medicine

## 2016-11-10 DIAGNOSIS — Z1231 Encounter for screening mammogram for malignant neoplasm of breast: Secondary | ICD-10-CM

## 2016-11-15 DIAGNOSIS — H35372 Puckering of macula, left eye: Secondary | ICD-10-CM | POA: Diagnosis not present

## 2016-11-15 DIAGNOSIS — H43813 Vitreous degeneration, bilateral: Secondary | ICD-10-CM | POA: Diagnosis not present

## 2016-11-15 DIAGNOSIS — H34811 Central retinal vein occlusion, right eye, with macular edema: Secondary | ICD-10-CM | POA: Diagnosis not present

## 2016-11-15 DIAGNOSIS — H3582 Retinal ischemia: Secondary | ICD-10-CM | POA: Diagnosis not present

## 2016-11-17 DIAGNOSIS — R197 Diarrhea, unspecified: Secondary | ICD-10-CM | POA: Diagnosis not present

## 2016-11-23 DIAGNOSIS — K5190 Ulcerative colitis, unspecified, without complications: Secondary | ICD-10-CM | POA: Diagnosis not present

## 2016-11-23 DIAGNOSIS — Z9049 Acquired absence of other specified parts of digestive tract: Secondary | ICD-10-CM | POA: Diagnosis not present

## 2016-12-26 ENCOUNTER — Ambulatory Visit: Payer: Self-pay

## 2017-01-31 DIAGNOSIS — E860 Dehydration: Secondary | ICD-10-CM | POA: Diagnosis not present

## 2017-01-31 DIAGNOSIS — R197 Diarrhea, unspecified: Secondary | ICD-10-CM | POA: Diagnosis not present

## 2017-01-31 DIAGNOSIS — R112 Nausea with vomiting, unspecified: Secondary | ICD-10-CM | POA: Diagnosis not present

## 2017-01-31 DIAGNOSIS — K5190 Ulcerative colitis, unspecified, without complications: Secondary | ICD-10-CM | POA: Diagnosis not present

## 2017-01-31 DIAGNOSIS — K921 Melena: Secondary | ICD-10-CM | POA: Diagnosis not present

## 2017-01-31 DIAGNOSIS — K219 Gastro-esophageal reflux disease without esophagitis: Secondary | ICD-10-CM | POA: Diagnosis not present

## 2017-01-31 DIAGNOSIS — R69 Illness, unspecified: Secondary | ICD-10-CM | POA: Diagnosis not present

## 2017-01-31 DIAGNOSIS — K645 Perianal venous thrombosis: Secondary | ICD-10-CM | POA: Diagnosis not present

## 2017-02-01 DIAGNOSIS — R69 Illness, unspecified: Secondary | ICD-10-CM | POA: Diagnosis not present

## 2017-02-01 DIAGNOSIS — R197 Diarrhea, unspecified: Secondary | ICD-10-CM | POA: Diagnosis not present

## 2017-02-01 DIAGNOSIS — K219 Gastro-esophageal reflux disease without esophagitis: Secondary | ICD-10-CM | POA: Diagnosis not present

## 2017-02-01 DIAGNOSIS — K645 Perianal venous thrombosis: Secondary | ICD-10-CM | POA: Diagnosis not present

## 2017-02-01 DIAGNOSIS — K921 Melena: Secondary | ICD-10-CM | POA: Diagnosis not present

## 2017-02-01 DIAGNOSIS — R112 Nausea with vomiting, unspecified: Secondary | ICD-10-CM | POA: Diagnosis not present

## 2017-02-01 DIAGNOSIS — K5190 Ulcerative colitis, unspecified, without complications: Secondary | ICD-10-CM | POA: Diagnosis not present

## 2017-02-02 DIAGNOSIS — K921 Melena: Secondary | ICD-10-CM | POA: Diagnosis not present

## 2017-02-02 DIAGNOSIS — K5289 Other specified noninfective gastroenteritis and colitis: Secondary | ICD-10-CM | POA: Diagnosis not present

## 2017-02-02 DIAGNOSIS — K644 Residual hemorrhoidal skin tags: Secondary | ICD-10-CM | POA: Diagnosis not present

## 2017-02-02 DIAGNOSIS — K219 Gastro-esophageal reflux disease without esophagitis: Secondary | ICD-10-CM | POA: Diagnosis not present

## 2017-02-02 DIAGNOSIS — R69 Illness, unspecified: Secondary | ICD-10-CM | POA: Diagnosis not present

## 2017-02-02 DIAGNOSIS — R112 Nausea with vomiting, unspecified: Secondary | ICD-10-CM | POA: Diagnosis not present

## 2017-02-02 DIAGNOSIS — K645 Perianal venous thrombosis: Secondary | ICD-10-CM | POA: Diagnosis not present

## 2017-02-02 DIAGNOSIS — K5190 Ulcerative colitis, unspecified, without complications: Secondary | ICD-10-CM | POA: Diagnosis not present

## 2017-02-02 DIAGNOSIS — K529 Noninfective gastroenteritis and colitis, unspecified: Secondary | ICD-10-CM | POA: Diagnosis not present

## 2017-02-02 DIAGNOSIS — R197 Diarrhea, unspecified: Secondary | ICD-10-CM | POA: Diagnosis not present

## 2017-02-03 DIAGNOSIS — K5190 Ulcerative colitis, unspecified, without complications: Secondary | ICD-10-CM | POA: Diagnosis not present

## 2017-02-03 DIAGNOSIS — K219 Gastro-esophageal reflux disease without esophagitis: Secondary | ICD-10-CM | POA: Diagnosis not present

## 2017-02-03 DIAGNOSIS — K921 Melena: Secondary | ICD-10-CM | POA: Diagnosis not present

## 2017-02-03 DIAGNOSIS — R69 Illness, unspecified: Secondary | ICD-10-CM | POA: Diagnosis not present

## 2017-02-03 DIAGNOSIS — R112 Nausea with vomiting, unspecified: Secondary | ICD-10-CM | POA: Diagnosis not present

## 2017-02-03 DIAGNOSIS — K645 Perianal venous thrombosis: Secondary | ICD-10-CM | POA: Diagnosis not present

## 2017-02-03 DIAGNOSIS — R197 Diarrhea, unspecified: Secondary | ICD-10-CM | POA: Diagnosis not present

## 2017-02-04 DIAGNOSIS — K5190 Ulcerative colitis, unspecified, without complications: Secondary | ICD-10-CM | POA: Diagnosis not present

## 2017-02-04 DIAGNOSIS — K219 Gastro-esophageal reflux disease without esophagitis: Secondary | ICD-10-CM | POA: Diagnosis not present

## 2017-02-04 DIAGNOSIS — R197 Diarrhea, unspecified: Secondary | ICD-10-CM | POA: Diagnosis not present

## 2017-02-04 DIAGNOSIS — K921 Melena: Secondary | ICD-10-CM | POA: Diagnosis not present

## 2017-02-04 DIAGNOSIS — K645 Perianal venous thrombosis: Secondary | ICD-10-CM | POA: Diagnosis not present

## 2017-02-04 DIAGNOSIS — R112 Nausea with vomiting, unspecified: Secondary | ICD-10-CM | POA: Diagnosis not present

## 2017-02-04 DIAGNOSIS — R69 Illness, unspecified: Secondary | ICD-10-CM | POA: Diagnosis not present

## 2017-02-17 DIAGNOSIS — K219 Gastro-esophageal reflux disease without esophagitis: Secondary | ICD-10-CM | POA: Diagnosis not present

## 2017-02-17 DIAGNOSIS — I10 Essential (primary) hypertension: Secondary | ICD-10-CM | POA: Diagnosis not present

## 2017-02-17 DIAGNOSIS — K51918 Ulcerative colitis, unspecified with other complication: Secondary | ICD-10-CM | POA: Diagnosis not present

## 2017-02-17 DIAGNOSIS — R69 Illness, unspecified: Secondary | ICD-10-CM | POA: Diagnosis not present

## 2017-03-09 DIAGNOSIS — H3561 Retinal hemorrhage, right eye: Secondary | ICD-10-CM | POA: Diagnosis not present

## 2017-03-09 DIAGNOSIS — H43393 Other vitreous opacities, bilateral: Secondary | ICD-10-CM | POA: Diagnosis not present

## 2017-03-09 DIAGNOSIS — Z961 Presence of intraocular lens: Secondary | ICD-10-CM | POA: Diagnosis not present

## 2017-03-09 DIAGNOSIS — H348112 Central retinal vein occlusion, right eye, stable: Secondary | ICD-10-CM | POA: Diagnosis not present

## 2017-03-09 DIAGNOSIS — H35033 Hypertensive retinopathy, bilateral: Secondary | ICD-10-CM | POA: Diagnosis not present

## 2017-03-22 ENCOUNTER — Encounter: Payer: Self-pay | Admitting: Gastroenterology

## 2017-03-28 IMAGING — CR DG CHEST 2V
2 series · 2 of 2 positions shown · non-contrast
Comparison: None.

CLINICAL DATA: Central chest pain and burning.  Initial encounter.

EXAM:
CHEST  2 VIEW

[w chest pa]
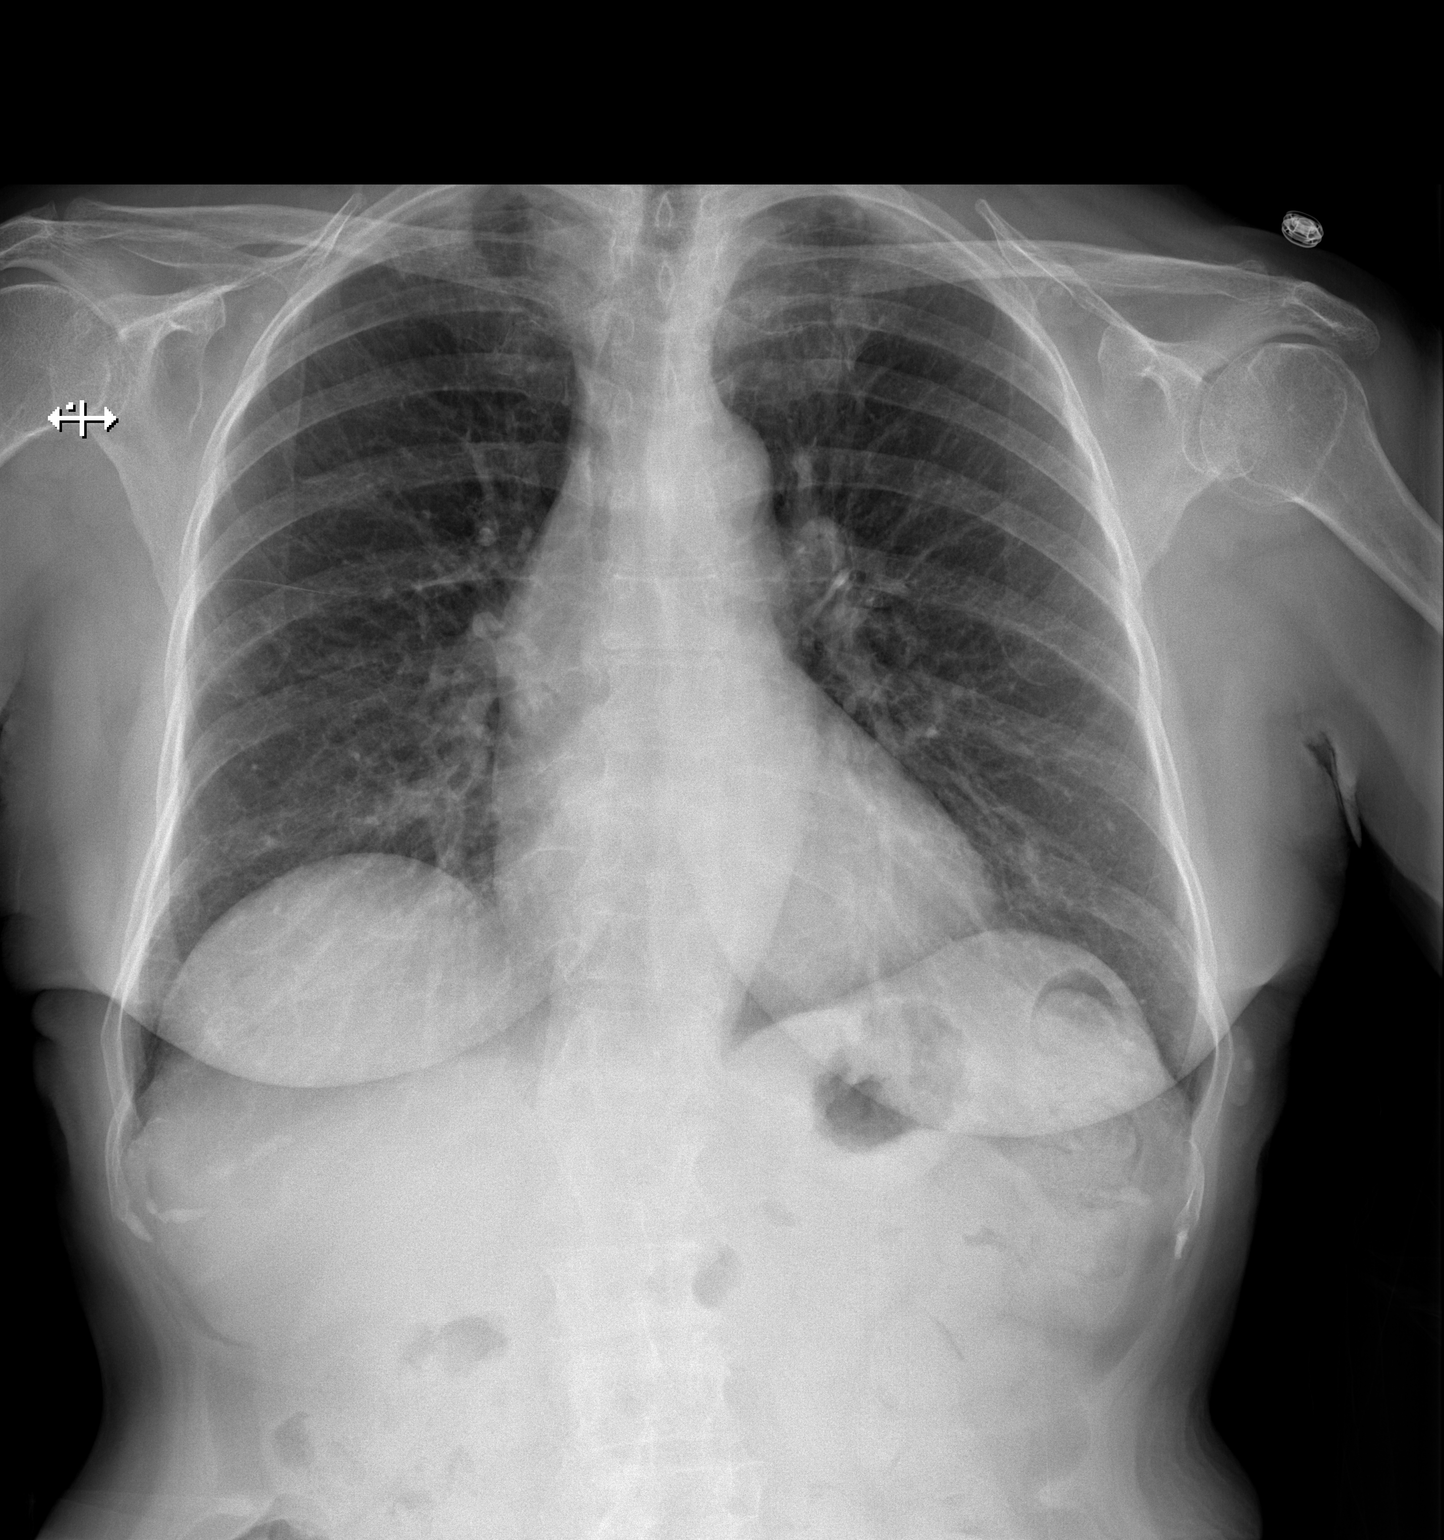

[w chest lat]
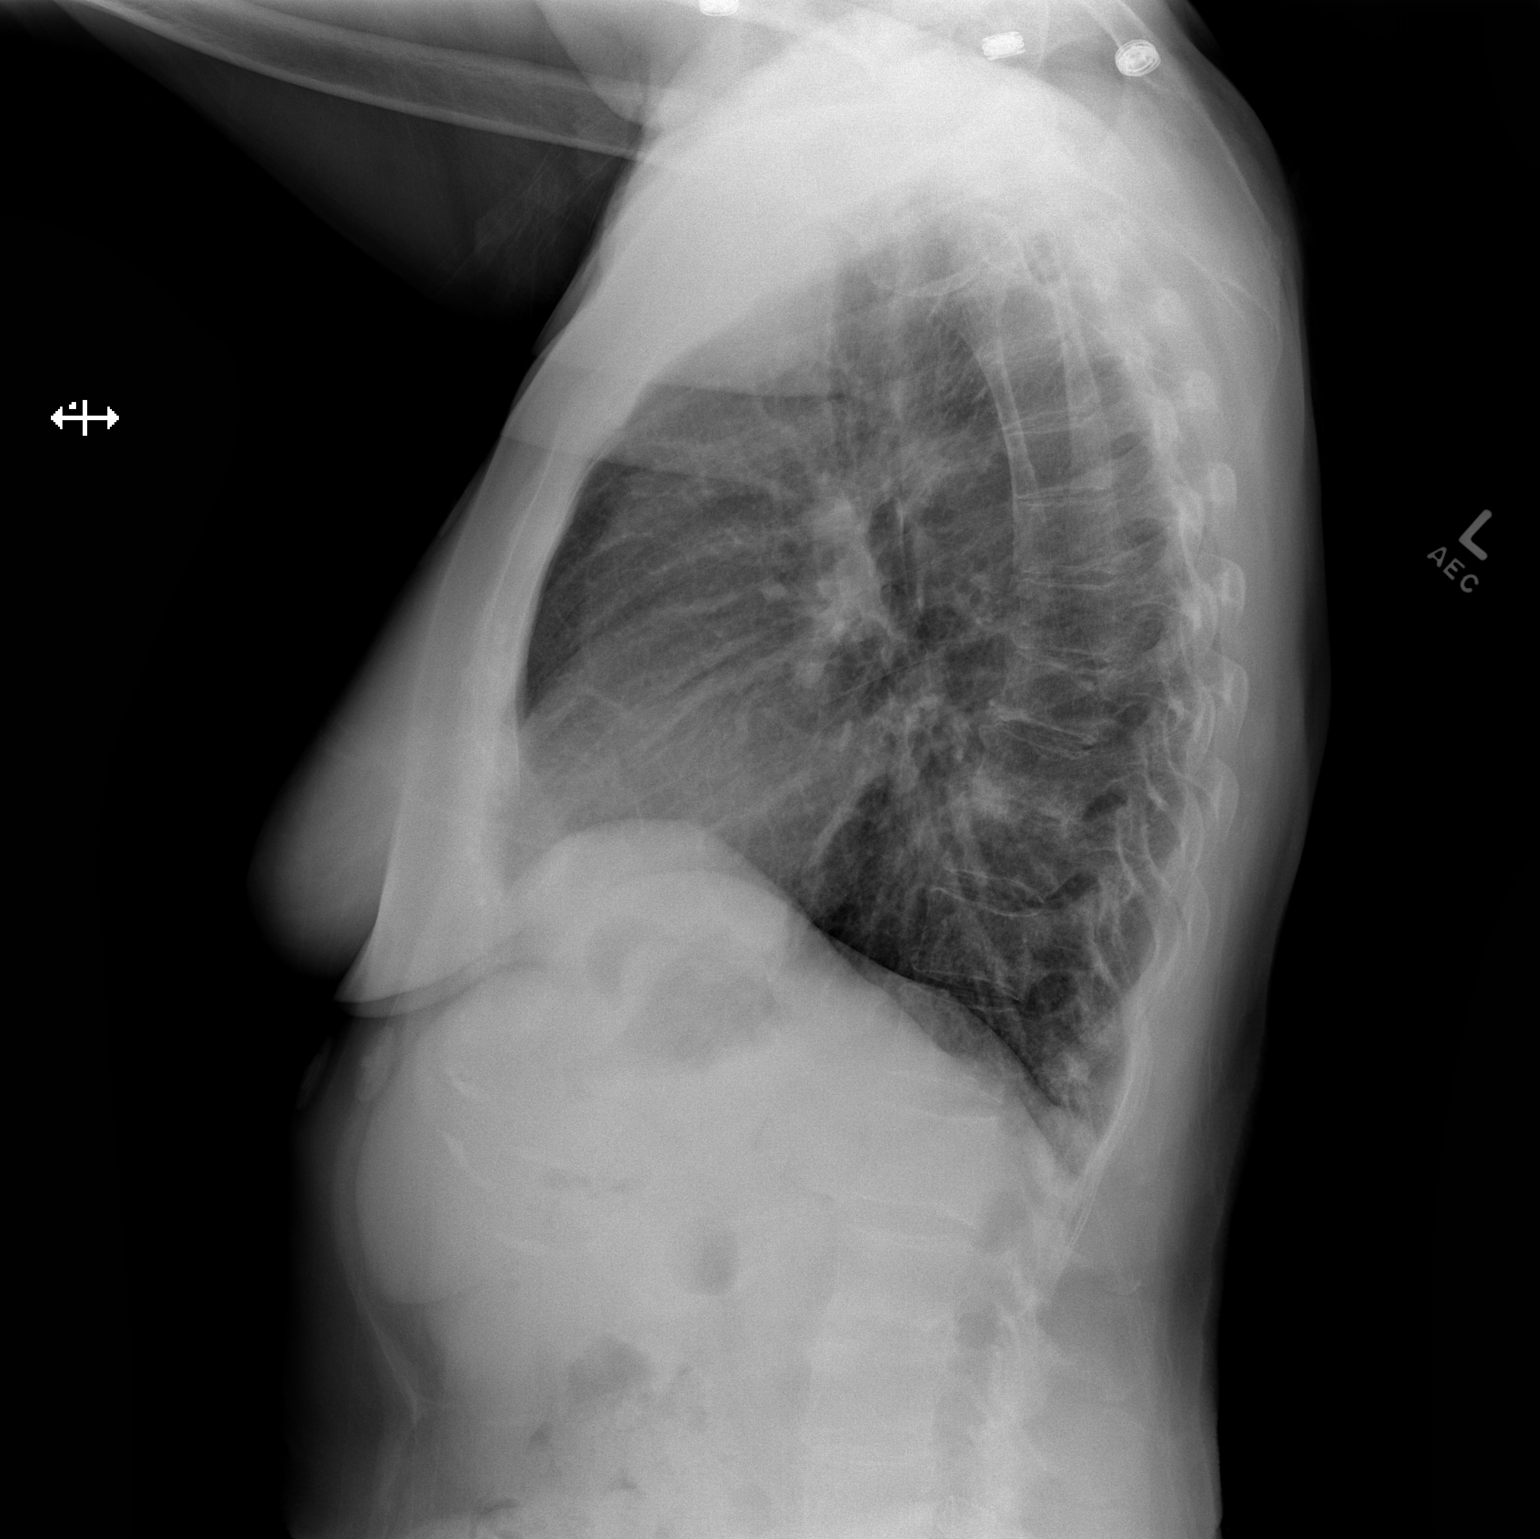

[2 of 2 positions shown; findings below may reference images not displayed]

FINDINGS: The lungs are clear. Heart size is normal. No pneumothorax or
pleural effusion. Scoliosis noted.
IMPRESSION: No acute disease.

## 2017-04-03 DIAGNOSIS — K51918 Ulcerative colitis, unspecified with other complication: Secondary | ICD-10-CM | POA: Diagnosis not present

## 2017-04-03 DIAGNOSIS — E559 Vitamin D deficiency, unspecified: Secondary | ICD-10-CM | POA: Diagnosis not present

## 2017-04-03 DIAGNOSIS — I10 Essential (primary) hypertension: Secondary | ICD-10-CM | POA: Diagnosis not present

## 2017-04-06 DIAGNOSIS — K51918 Ulcerative colitis, unspecified with other complication: Secondary | ICD-10-CM | POA: Diagnosis not present

## 2017-04-06 DIAGNOSIS — Z23 Encounter for immunization: Secondary | ICD-10-CM | POA: Diagnosis not present

## 2017-04-06 DIAGNOSIS — R69 Illness, unspecified: Secondary | ICD-10-CM | POA: Diagnosis not present

## 2017-04-06 DIAGNOSIS — I10 Essential (primary) hypertension: Secondary | ICD-10-CM | POA: Diagnosis not present

## 2017-04-06 DIAGNOSIS — K219 Gastro-esophageal reflux disease without esophagitis: Secondary | ICD-10-CM | POA: Diagnosis not present

## 2017-04-06 DIAGNOSIS — Z0000 Encounter for general adult medical examination without abnormal findings: Secondary | ICD-10-CM | POA: Diagnosis not present

## 2019-04-12 IMAGING — CT CT ABD-PELV W/ CM
2 of 5 series · 16 of 46 positions shown, 18 images · IV contrast (APPLIED)
Comparison: 02/19/2015 and 10/30/2014

CLINICAL DATA: History of ulcerative colitis. Abdominal pain
several days with nausea.

EXAM:
CT ABDOMEN AND PELVIS WITH CONTRAST
TECHNIQUE: Multidetector CT imaging of the abdomen and pelvis was performed
using the standard protocol following bolus administration of
intravenous contrast.
CONTRAST:  100mL 8B1IF8-Y33 IOPAMIDOL (8B1IF8-Y33) INJECTION 61%

[Series 2: axial st · axial · 0.74mm/px · z∈[-403,+7]mm · 13 of 94 slices shown, 15 images]
[im 6/94  soft-tissue]
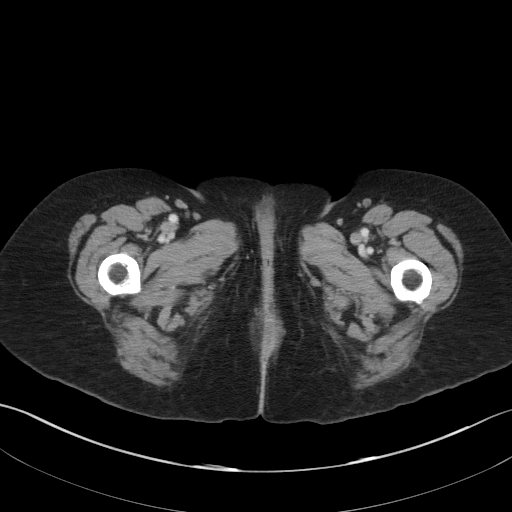
[im 6/94  bone]
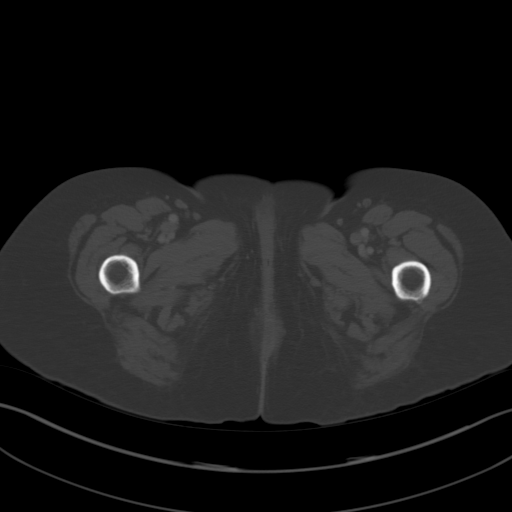
[im 11/94  soft-tissue]
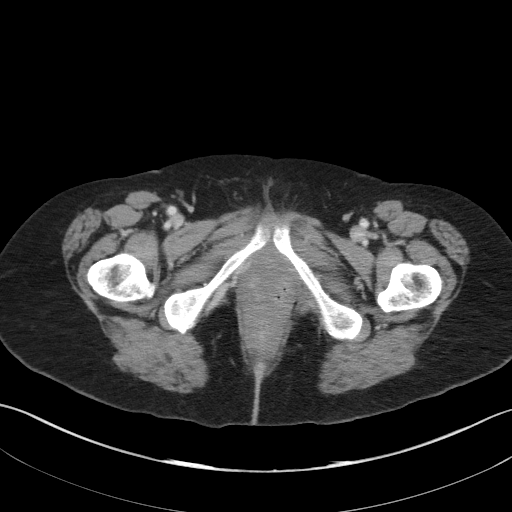
[im 21/94  soft-tissue]
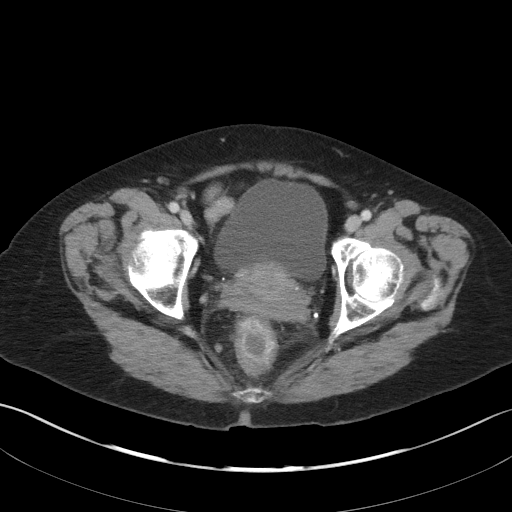
[im 26/94  soft-tissue]
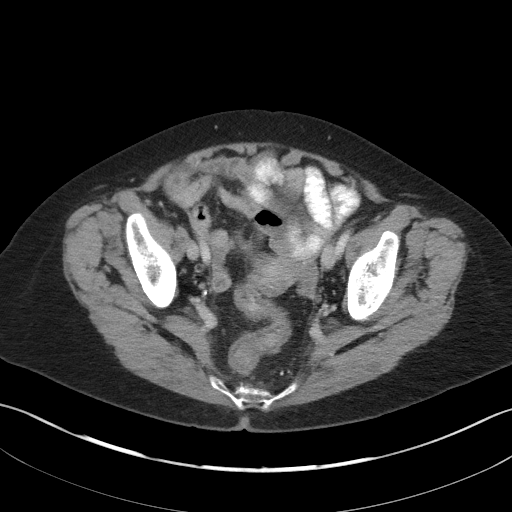
[im 32/94  soft-tissue]
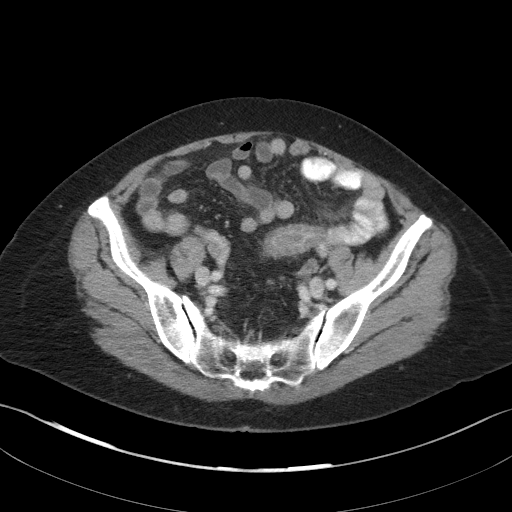
[im 42/94  soft-tissue]
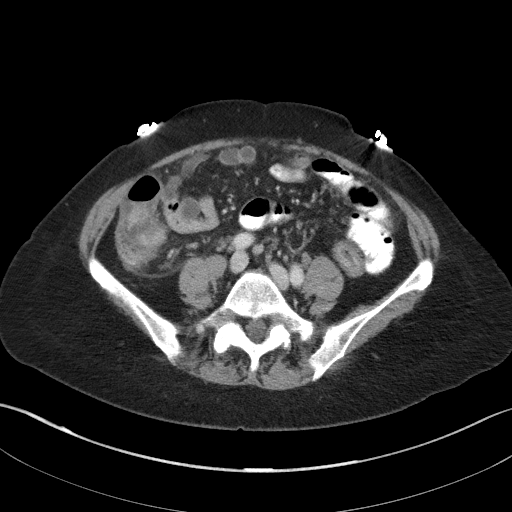
[im 47/94  soft-tissue]
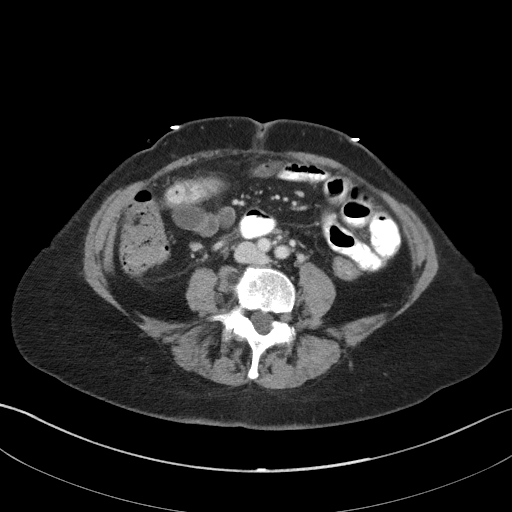
[im 52/94  soft-tissue]
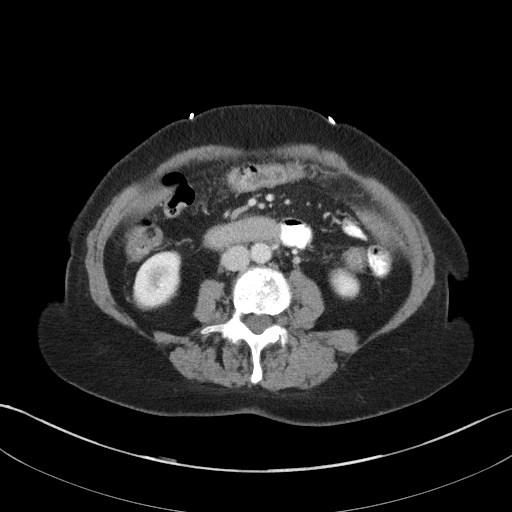
[im 63/94  soft-tissue]
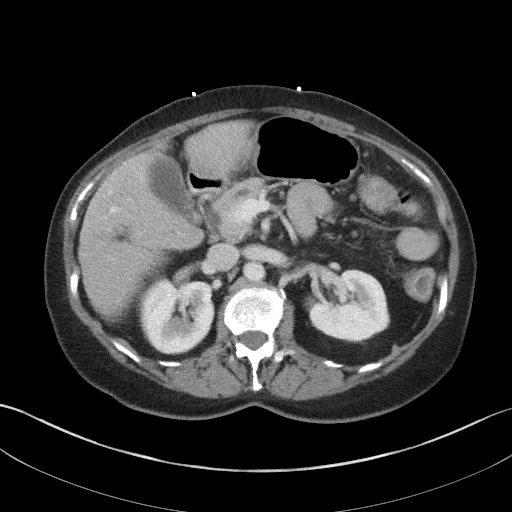
[im 63/94  bone]
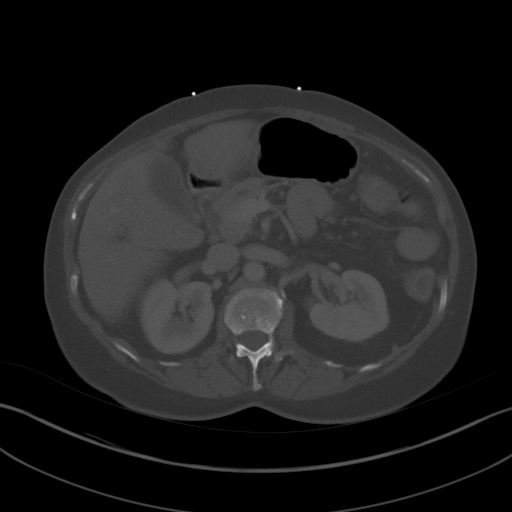
[im 68/94  soft-tissue]
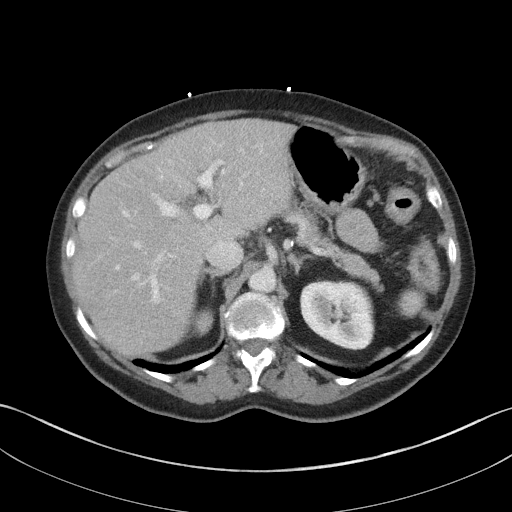
[im 73/94  soft-tissue]
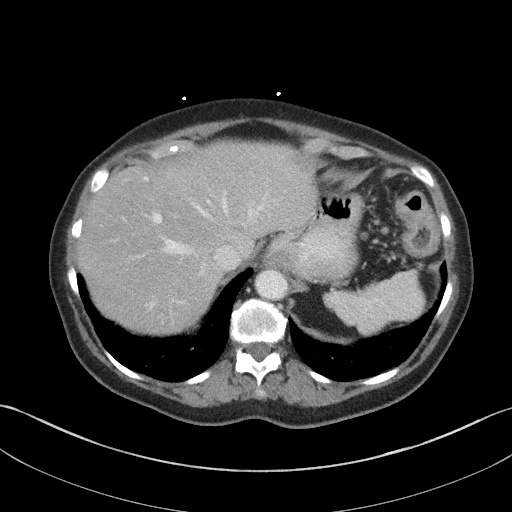
[im 83/94  soft-tissue]
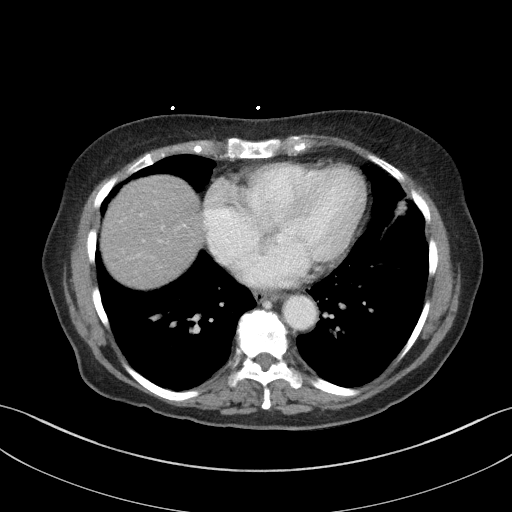
[im 88/94  soft-tissue]
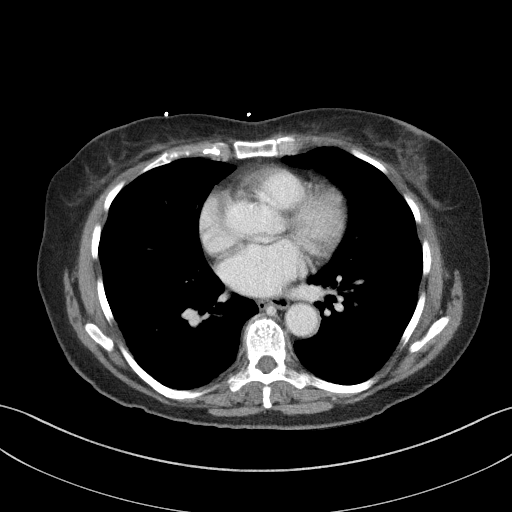

[Series 5: coronal st · coronal · 0.86mm/px · 3 of 80 slices shown]
[im 27/80  soft-tissue]
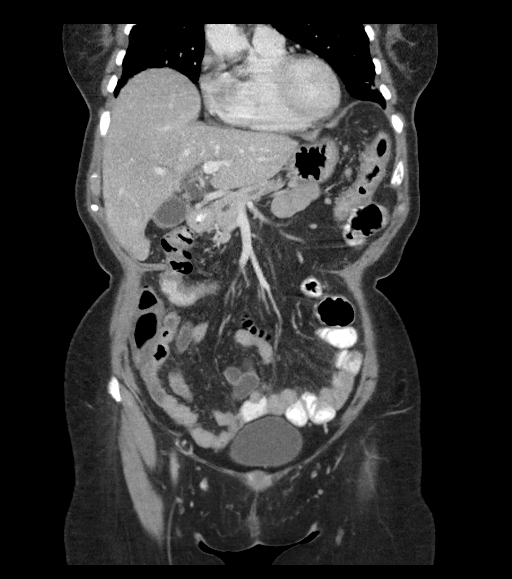
[im 36/80  soft-tissue]
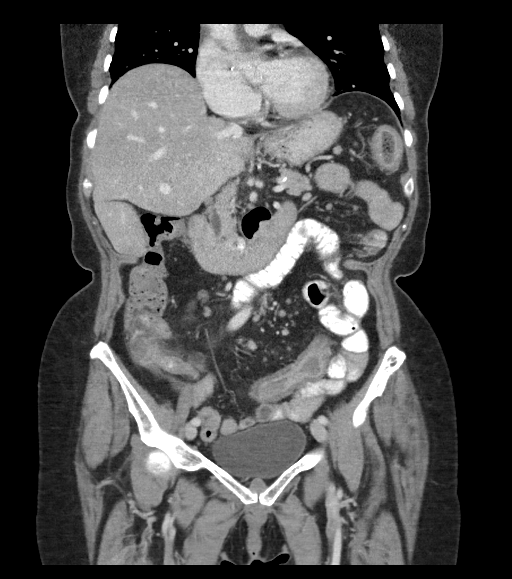
[im 44/80  soft-tissue]
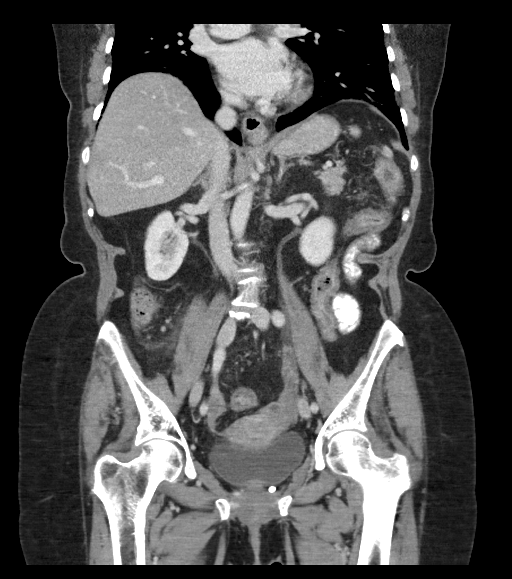

[16 of 46 positions shown; findings below may reference images not displayed]

FINDINGS: Lower chest: Mild peripheral nodularity over the lingula without
significant change likely atelectasis/scarring.

Hepatobiliary: Subcentimeter hypodensity over the left lobe of the
liver unchanged likely a cyst. Gallbladder and biliary tree are
normal.

Pancreas: Within normal.

Spleen: Within normal.

Adrenals/Urinary Tract: Adrenal glands are normal. Kidneys are
normal in size. 4 mm calcification over the lower pole of the left
kidney unchanged with adjacent lower pole left renal cortical
scarring. 1.7 cm right parapelvic cyst. Subtle prominence of the
right intrarenal collecting system. Ureters and bladder are within
normal.

Stomach/Bowel: Stomach and small bowel are within normal. The
appendix is enlarged more noticeable proximally where it measures
1.5 cm in diameter. There is mild adjacent inflammatory change in
the right lower quadrant and small amount of free fluid. Findings
likely due to mild acute appendicitis without evidence of
perforation or abscess. The transverse, descending and rectosigmoid
colon are decompressed with mild wall thickening of the colon distal
to the splenic flexure likely related to patient's ulcerative
colitis. Cannot exclude mild active acute colitis.

Vascular/Lymphatic: Minimal calcified plaque over the abdominal
aorta. Few small scattered mesenteric lymph nodes present.

Reproductive: Within normal.

Other: No evidence of abdominal wall abnormality.

Musculoskeletal: Degenerative change of the spine with multilevel
disc disease over the lumbar spine and lower thoracic spine.
IMPRESSION: Evidence of mild uncomplicated acute appendicitis. No evidence of
perforation or abscess.

Mild decompression and wall thickening involving the colon distal to
the splenic flexure likely due to patient's known ulcer colitis.
Cannot exclude mild active acute colitis.

Subcentimeter liver cyst unchanged.

1.7 cm right renal cyst. Minimal prominence of the right intrarenal
collecting system.

These results were called by telephone at the time of interpretation
on 10/28/2016 at [DATE] to Dr. LINDOBERTO EBERTZ , who verbally
acknowledged these results.
